# Patient Record
Sex: Male | Born: 1945 | Race: White | Hispanic: No | Marital: Married
Health system: Southern US, Community
[De-identification: ages and names within clinical notes are randomized; demographics above are authoritative.]

## PROBLEM LIST (undated history)

## (undated) DIAGNOSIS — E785 Hyperlipidemia, unspecified: Secondary | ICD-10-CM

## (undated) DIAGNOSIS — I4891 Unspecified atrial fibrillation: Secondary | ICD-10-CM

## (undated) DIAGNOSIS — J449 Chronic obstructive pulmonary disease, unspecified: Secondary | ICD-10-CM

## (undated) DIAGNOSIS — E079 Disorder of thyroid, unspecified: Secondary | ICD-10-CM

## (undated) DIAGNOSIS — I10 Essential (primary) hypertension: Secondary | ICD-10-CM

## (undated) DIAGNOSIS — I2510 Atherosclerotic heart disease of native coronary artery without angina pectoris: Secondary | ICD-10-CM

## (undated) DIAGNOSIS — E039 Hypothyroidism, unspecified: Secondary | ICD-10-CM

## (undated) HISTORY — PX: CARDIOVERSION: SHX1299

## (undated) HISTORY — PX: EYE SURGERY: SHX253

## (undated) HISTORY — PX: CARDIAC CATHETERIZATION: SHX172

## (undated) HISTORY — PX: TONSILLECTOMY: SUR1361

---

## 1997-07-06 ENCOUNTER — Emergency Department (HOSPITAL_COMMUNITY): Admission: RE | Admit: 1997-07-06 | Discharge: 1997-07-06 | Payer: Self-pay | Admitting: Emergency Medicine

## 2000-04-11 HISTORY — PX: CORONARY ARTERY BYPASS GRAFT: SHX141

## 2001-01-24 ENCOUNTER — Encounter: Payer: Self-pay | Admitting: Cardiology

## 2001-01-24 ENCOUNTER — Encounter: Admission: RE | Admit: 2001-01-24 | Discharge: 2001-01-24 | Payer: Self-pay | Admitting: Cardiology

## 2001-01-29 ENCOUNTER — Encounter: Payer: Self-pay | Admitting: Cardiology

## 2001-01-29 ENCOUNTER — Ambulatory Visit (HOSPITAL_COMMUNITY): Admission: RE | Admit: 2001-01-29 | Discharge: 2001-01-29 | Payer: Self-pay | Admitting: Cardiology

## 2001-02-06 ENCOUNTER — Encounter: Payer: Self-pay | Admitting: Cardiothoracic Surgery

## 2001-02-08 ENCOUNTER — Inpatient Hospital Stay (HOSPITAL_COMMUNITY): Admission: RE | Admit: 2001-02-08 | Discharge: 2001-02-13 | Payer: Self-pay | Admitting: Cardiothoracic Surgery

## 2001-02-08 ENCOUNTER — Encounter: Payer: Self-pay | Admitting: Cardiothoracic Surgery

## 2001-02-09 ENCOUNTER — Encounter: Payer: Self-pay | Admitting: Cardiothoracic Surgery

## 2001-02-10 ENCOUNTER — Encounter: Payer: Self-pay | Admitting: Cardiothoracic Surgery

## 2001-03-02 ENCOUNTER — Encounter: Payer: Self-pay | Admitting: Cardiothoracic Surgery

## 2001-03-02 ENCOUNTER — Encounter: Admission: RE | Admit: 2001-03-02 | Discharge: 2001-03-02 | Payer: Self-pay | Admitting: Cardiothoracic Surgery

## 2001-03-13 ENCOUNTER — Encounter (HOSPITAL_COMMUNITY): Admission: RE | Admit: 2001-03-13 | Discharge: 2001-06-11 | Payer: Self-pay | Admitting: Cardiology

## 2001-04-27 ENCOUNTER — Encounter: Payer: Self-pay | Admitting: Cardiothoracic Surgery

## 2001-04-27 ENCOUNTER — Encounter: Admission: RE | Admit: 2001-04-27 | Discharge: 2001-04-27 | Payer: Self-pay | Admitting: Cardiothoracic Surgery

## 2001-05-04 ENCOUNTER — Encounter: Payer: Self-pay | Admitting: Cardiothoracic Surgery

## 2001-05-04 ENCOUNTER — Encounter: Admission: RE | Admit: 2001-05-04 | Discharge: 2001-05-04 | Payer: Self-pay | Admitting: Cardiothoracic Surgery

## 2001-06-12 ENCOUNTER — Encounter (HOSPITAL_COMMUNITY): Admission: RE | Admit: 2001-06-12 | Discharge: 2001-09-10 | Payer: Self-pay | Admitting: Cardiology

## 2007-01-31 ENCOUNTER — Ambulatory Visit: Payer: Self-pay | Admitting: Family Medicine

## 2015-11-16 ENCOUNTER — Telehealth (HOSPITAL_COMMUNITY): Payer: Self-pay

## 2015-11-16 NOTE — Telephone Encounter (Signed)
Called patient in regards to Pulmonary Rehab referral. No way to leave message. Will follow up at another time.

## 2015-12-07 ENCOUNTER — Encounter (HOSPITAL_COMMUNITY)
Admission: RE | Admit: 2015-12-07 | Discharge: 2015-12-07 | Disposition: A | Discharge status: Home / Self Care | Payer: Self-pay | Source: Ambulatory Visit | Attending: Pulmonary Disease | Admitting: Pulmonary Disease

## 2015-12-07 ENCOUNTER — Encounter (HOSPITAL_COMMUNITY): Payer: Self-pay

## 2015-12-07 VITALS — BP 149/81 | HR 89 | Ht 65.0 in | Wt 184.1 lb

## 2015-12-07 DIAGNOSIS — J439 Emphysema, unspecified: Secondary | ICD-10-CM

## 2015-12-07 HISTORY — DX: Hyperlipidemia, unspecified: E78.5

## 2015-12-07 HISTORY — DX: Essential (primary) hypertension: I10

## 2015-12-07 HISTORY — DX: Disorder of thyroid, unspecified: E07.9

## 2015-12-07 NOTE — Progress Notes (Signed)
Lucas OrleansJoseph D Gagan 70 y.o. male Pulmonary Rehab Orientation Note Patient arrived today in Cardiac and Pulmonary Rehab for orientation to Pulmonary Rehab. [facility] was [facility] via wheel chair. He does not carry portable oxygen. Per pt, he uses oxygen never. Color good, skin warm and dry. Patient is oriented to time and place. Patient's medical history, psychosocial health, and medications reviewed. Psychosocial assessment reveals pt lives with their spouse. Pt is currently retired. He is a Research scientist (life sciences)avy Veteran, and lastly worked for [employer].   Pt hobbies include watching sports and movies. He is involved with his grandson taking him to and from football practice 3 times per week and coached football in the past. Pt reports his stress level is low.  Patient  Does not exhibit signs of depression. PHQ2/9 score 0/0. Pt shows good  coping skills with positive outlook .  Physical assessment reveals heart rate is normal, breath sounds clear to auscultation, no wheezes, rales, or rhonchi. Grip strength equal, strong. Distal pulses 2+ bilateral posterior tibial pulses present without peripheral edema. Patient reports he does take medications as prescribed. Patient states he follows a Regular diet. The patient reports no specific efforts to gain or lose weight.. Patient's weight will be monitored closely. Demonstration and practice of PLB using pulse oximeter. Patient able to return demonstration satisfactorily. Safety and hand hygiene in the exercise area reviewed with patient. Patient voices understanding of the information reviewed. Department expectations discussed with patient and achievable goals were set. The patient shows enthusiasm about attending the program and we look forward to working with this nice gentleman. The patient is scheduled for a 6 min walk test on Thursday December 10, 2015 @ 3:30pm and to begin exercise on Thursday, December 17, 2015 in the 1030 class..   [id number]

## 2015-12-09 ENCOUNTER — Encounter (HOSPITAL_COMMUNITY): Payer: Self-pay | Admitting: *Deleted

## 2015-12-10 ENCOUNTER — Encounter (HOSPITAL_COMMUNITY)
Admission: RE | Admit: 2015-12-10 | Discharge: 2015-12-10 | Disposition: A | Discharge status: Home / Self Care | Payer: Self-pay | Source: Ambulatory Visit | Attending: Pulmonary Disease | Admitting: Pulmonary Disease

## 2015-12-10 DIAGNOSIS — J439 Emphysema, unspecified: Secondary | ICD-10-CM

## 2015-12-10 NOTE — Progress Notes (Signed)
Lucas LongsJoseph arrived for his 6 minute walk test, had an irregular pulse, placed on the Zoll and was found to be in atrial fibrillation, rate 85-122, asymptomatic.  BP 138/80, spo2 96% RA.  I spoke with Loistine ChancePhilip a triage nurse at [facility] [place].  I was instructed to have patient call for an appointment at the same day clinic tomorrow morning after 8 am.  Instrutted to take his aspirin 325 mg today.  If he develops any distress or fast heart rate to go to the [facility].  He voiced understanding and was transported [place] the [place] to drive home.  Again, he was in no distress. The 6 minute walk test was not performed.

## 2015-12-17 ENCOUNTER — Telehealth (HOSPITAL_COMMUNITY): Payer: Self-pay | Admitting: *Deleted

## 2015-12-17 ENCOUNTER — Ambulatory Visit (HOSPITAL_COMMUNITY): Payer: Self-pay

## 2015-12-22 ENCOUNTER — Ambulatory Visit (HOSPITAL_COMMUNITY): Payer: Self-pay

## 2015-12-24 ENCOUNTER — Ambulatory Visit (HOSPITAL_COMMUNITY): Payer: Self-pay

## 2015-12-29 ENCOUNTER — Ambulatory Visit (HOSPITAL_COMMUNITY): Payer: Self-pay

## 2015-12-31 ENCOUNTER — Ambulatory Visit (HOSPITAL_COMMUNITY): Payer: Self-pay

## 2016-01-05 ENCOUNTER — Ambulatory Visit (HOSPITAL_COMMUNITY): Payer: Self-pay

## 2016-01-07 ENCOUNTER — Ambulatory Visit (HOSPITAL_COMMUNITY): Payer: Self-pay

## 2016-01-11 NOTE — Addendum Note (Signed)
Encounter addended by: Drema PryJoan A Shantanique Hodo, RN on: 01/11/2016 10:02 AM<BR>    Actions taken: Sign clinical note, Episode resolved

## 2016-01-11 NOTE — Progress Notes (Signed)
Pulmonary Rehab Discharge Note  Unk PintoJoseph Polivka attended orientation 12/07/2015 and returned for his 6 minute walk test on 12/10/2015 and was found to be in atrial fib at a controlled rate, which was a new diagnosis for him.  His walk test was postponed and [facility] [place] was notified and he was asked to call for a next day appointment the following day.  Multiple phone calls have been made by pulmonary rehab staff to check on patient and we are not able to leave a message and paitient has not called us to let us know [facility] findings.  He has been discharged from the program without exercising at all.

## 2016-01-12 ENCOUNTER — Ambulatory Visit (HOSPITAL_COMMUNITY): Payer: Self-pay

## 2016-01-14 ENCOUNTER — Ambulatory Visit (HOSPITAL_COMMUNITY): Payer: Self-pay

## 2016-01-15 NOTE — Addendum Note (Signed)
Encounter addended by: Jacques EarthlyEdna Brewbaker Paulene Tayag, RD on: 01/15/2016 12:11 PM<BR>    Actions taken: Visit Navigator Flowsheet section accepted

## 2016-01-19 ENCOUNTER — Ambulatory Visit (HOSPITAL_COMMUNITY): Payer: Self-pay

## 2016-01-21 ENCOUNTER — Ambulatory Visit (HOSPITAL_COMMUNITY): Payer: Self-pay

## 2016-01-26 ENCOUNTER — Ambulatory Visit (HOSPITAL_COMMUNITY): Payer: Self-pay

## 2016-01-28 ENCOUNTER — Ambulatory Visit (HOSPITAL_COMMUNITY): Payer: Self-pay

## 2016-02-02 ENCOUNTER — Ambulatory Visit (HOSPITAL_COMMUNITY): Payer: Self-pay

## 2016-02-04 ENCOUNTER — Ambulatory Visit (HOSPITAL_COMMUNITY): Payer: Self-pay

## 2016-02-09 ENCOUNTER — Ambulatory Visit (HOSPITAL_COMMUNITY): Payer: Self-pay

## 2016-02-11 ENCOUNTER — Ambulatory Visit (HOSPITAL_COMMUNITY): Payer: Self-pay

## 2016-02-16 ENCOUNTER — Ambulatory Visit (HOSPITAL_COMMUNITY): Payer: Self-pay

## 2016-02-18 ENCOUNTER — Ambulatory Visit (HOSPITAL_COMMUNITY): Payer: Self-pay

## 2016-02-23 ENCOUNTER — Ambulatory Visit (HOSPITAL_COMMUNITY): Payer: Self-pay

## 2016-02-25 ENCOUNTER — Ambulatory Visit (HOSPITAL_COMMUNITY): Payer: Self-pay

## 2016-03-01 ENCOUNTER — Ambulatory Visit (HOSPITAL_COMMUNITY): Payer: Self-pay

## 2016-03-03 ENCOUNTER — Ambulatory Visit (HOSPITAL_COMMUNITY): Payer: Self-pay

## 2016-03-08 ENCOUNTER — Ambulatory Visit (HOSPITAL_COMMUNITY): Payer: Self-pay

## 2016-03-10 ENCOUNTER — Ambulatory Visit (HOSPITAL_COMMUNITY): Payer: Self-pay

## 2016-03-15 ENCOUNTER — Ambulatory Visit (HOSPITAL_COMMUNITY): Payer: Self-pay

## 2016-03-17 ENCOUNTER — Ambulatory Visit (HOSPITAL_COMMUNITY): Payer: Self-pay

## 2016-03-22 ENCOUNTER — Ambulatory Visit (HOSPITAL_COMMUNITY): Payer: Self-pay

## 2016-03-24 ENCOUNTER — Ambulatory Visit (HOSPITAL_COMMUNITY): Payer: Self-pay

## 2016-03-29 ENCOUNTER — Ambulatory Visit (HOSPITAL_COMMUNITY): Payer: Self-pay

## 2016-03-31 ENCOUNTER — Ambulatory Visit (HOSPITAL_COMMUNITY): Payer: Self-pay

## 2016-04-05 ENCOUNTER — Ambulatory Visit (HOSPITAL_COMMUNITY): Payer: Self-pay

## 2016-04-07 ENCOUNTER — Ambulatory Visit (HOSPITAL_COMMUNITY): Payer: Self-pay

## 2016-04-12 ENCOUNTER — Ambulatory Visit (HOSPITAL_COMMUNITY): Payer: Self-pay

## 2016-04-14 ENCOUNTER — Ambulatory Visit (HOSPITAL_COMMUNITY): Payer: Self-pay

## 2016-04-19 ENCOUNTER — Ambulatory Visit (HOSPITAL_COMMUNITY): Payer: Self-pay

## 2016-04-21 ENCOUNTER — Ambulatory Visit (HOSPITAL_COMMUNITY): Payer: Self-pay

## 2016-04-26 ENCOUNTER — Ambulatory Visit (HOSPITAL_COMMUNITY): Payer: Self-pay

## 2016-04-28 ENCOUNTER — Ambulatory Visit (HOSPITAL_COMMUNITY): Payer: Self-pay

## 2016-05-03 ENCOUNTER — Ambulatory Visit (HOSPITAL_COMMUNITY): Payer: Self-pay

## 2016-05-09 ENCOUNTER — Telehealth (HOSPITAL_COMMUNITY): Payer: Self-pay | Admitting: *Deleted

## 2016-05-10 ENCOUNTER — Telehealth (HOSPITAL_COMMUNITY): Payer: Self-pay | Admitting: *Deleted

## 2018-02-14 ENCOUNTER — Ambulatory Visit (INDEPENDENT_AMBULATORY_CARE_PROVIDER_SITE_OTHER): Payer: No Typology Code available for payment source | Admitting: Vascular Surgery

## 2018-02-14 ENCOUNTER — Encounter: Payer: Self-pay | Admitting: Vascular Surgery

## 2018-02-14 ENCOUNTER — Other Ambulatory Visit: Payer: Self-pay

## 2018-02-14 VITALS — BP 137/86 | HR 83 | Temp 98.7°F | Resp 20 | Ht 65.0 in | Wt 174.0 lb

## 2018-02-14 DIAGNOSIS — I714 Abdominal aortic aneurysm, without rupture, unspecified: Secondary | ICD-10-CM

## 2018-02-14 NOTE — Progress Notes (Signed)
REASON FOR CONSULT:    Abdominal aortic aneurysm.  The consult was requested by the [place].  HPI:   Lucas Garcia is a pleasant 72 y.o. male, who was referred with a 5.5 cm infrarenal abdominal aortic aneurysm.  This was apparently discovered about a year and a half ago.  Over the last year this is enlarged from 5 cm to 5.5 cm according to the patient.  He denies any abdominal pain or back pain.  He has no family history of aneurysmal disease that he is aware of.  He does have significant pulmonary issues and uses oxygen as needed.  He has undergone previous coronary revascularization in 2002.  Vein was taken from the right leg.  He said no recent chest pain or chest pressure.  He denies any history of previous myocardial infarction or history of congestive heart failure.  He does have hypertension and hypercholesterolemia which are well controlled on medications.  He denies any history of diabetes.  He is on Xarelto for atrial fibrillation.  He apparently has been cardioverted once but his A. fib returned.  This is followed by his cardiologist at the [place].  Past Medical History:  Diagnosis Date  . Hyperlipidemia   . Hypertension   . Thyroid disease     Family History  Problem Relation Age of Onset  . Alzheimer's disease Mother   . Heart disease Father     SOCIAL HISTORY: He had smoked to 2 packs/day of cigarettes for many years but quit 2 years ago. Social History   Socioeconomic History  . Marital status: Married    Spouse name: Not on file  . Number of children: Not on file  . Years of education: Not on file  . Highest education level: Not on file  Occupational History  . Not on file  Social Needs  . Financial resource strain: Not on file  . Food insecurity:    Worry: Not on file    Inability: Not on file  . Transportation needs:    Medical: Not on file    Non-medical: Not on file  Tobacco Use  . Smoking status: Former Smoker    Packs/day: 0.25    Years: 58.00      Pack years: 14.50    Types: Cigarettes    Last attempt to quit: 04/11/2016    Years since quitting: 1.8  . Smokeless tobacco: Never Used  Substance and Sexual Activity  . Alcohol use: No  . Drug use: No  . Sexual activity: Not on file  Lifestyle  . Physical activity:    Days per week: Not on file    Minutes per session: Not on file  . Stress: Not on file  Relationships  . Social connections:    Talks on phone: Not on file    Gets together: Not on file    Attends religious service: Not on file    Active member of club or organization: Not on file    Attends meetings of clubs or organizations: Not on file    Relationship status: Not on file  . Intimate partner violence:    Fear of current or ex partner: Not on file    Emotionally abused: Not on file    Physically abused: Not on file    Forced sexual activity: Not on file  Other Topics Concern  . Not on file  Social History Narrative  . Not on file    No Known Allergies  Current Outpatient Medications  Medication Sig Dispense Refill  . albuterol (PROVENTIL HFA;VENTOLIN HFA) 108 (90 Base) MCG/ACT inhaler Inhale 2 puffs into the lungs every 6 (six) hours as needed for wheezing or shortness of breath.    Marland Kitchen amLODipine (NORVASC) 10 MG tablet Take 10 mg by mouth daily.    Marland Kitchen aspirin 325 MG EC tablet Take 325 mg by mouth daily.    Marland Kitchen atorvastatin (LIPITOR) 40 MG tablet Take 40 mg by mouth daily.    . Cholecalciferol (VITAMIN D) 50 MCG (2000 UT) tablet Take 2,000 Units by mouth daily.    . ferrous sulfate 325 (65 FE) MG tablet Take 325 mg by mouth daily with breakfast.    . Ipratropium-Albuterol (COMBIVENT RESPIMAT IN) Inhale into the lungs.    Marland Kitchen levothyroxine (SYNTHROID, LEVOTHROID) 88 MCG tablet Take 88 mcg by mouth daily before breakfast.    . magnesium oxide (MAG-OX) 400 MG tablet Take 400 mg by mouth daily.    . rivaroxaban (XARELTO) 20 MG TABS tablet Take 20 mg by mouth daily with supper.    . tiotropium (SPIRIVA) 18 MCG  inhalation capsule Place 18 mcg into inhaler and inhale daily.    . traZODone (DESYREL) 50 MG tablet Take 50 mg by mouth at bedtime.    Marland Kitchen TIOTROPIUM BROMIDE-OLODATEROL IN Inhale into the lungs.     No current facility-administered medications for this visit.     REVIEW OF SYSTEMS:  [X]  denotes positive finding, [ ]  denotes negative finding Cardiac  Comments:  Chest pain or chest pressure:    Shortness of breath upon exertion: x   Short of breath when lying flat: x   Irregular heart rhythm: x  A. fib      Vascular    Pain in calf, thigh, or hip brought on by ambulation:    Pain in feet at night that wakes you up from your sleep:     Blood clot in your veins:    Leg swelling:         Pulmonary    Oxygen at home:    Productive cough:     Wheezing:         Neurologic    Sudden weakness in arms or legs:     Sudden numbness in arms or legs:     Sudden onset of difficulty speaking or slurred speech:    Temporary loss of vision in one eye:     Problems with dizziness:         Gastrointestinal    Blood in stool:     Vomited blood:         Genitourinary    Burning when urinating:     Blood in urine:        Psychiatric    Major depression:         Hematologic    Bleeding problems:    Problems with blood clotting too easily:        Skin    Rashes or ulcers:        Constitutional    Fever or chills:     PHYSICAL EXAM:   Vitals:   02/14/18 1511  BP: 137/86  Pulse: 83  Resp: 20  Temp: 98.7 F (37.1 C)  TempSrc: Oral  SpO2: (!) 89%  Weight: 174 lb (78.9 kg)  Height: 5\' 5"  (1.651 m)    GENERAL: The patient is a well-nourished male, in no acute distress. The vital signs are documented above. CARDIAC: There is a regular rate and  rhythm.  VASCULAR: I do not detect carotid bruits. He has palpable femoral pulses bilaterally. I cannot palpate popliteal pulses. He has monophasic posterior tibial signals with the Doppler and barely biphasic dorsalis pedis signals with  the Doppler bilaterally. He has no significant lower extremity swelling. PULMONARY: There is good air exchange bilaterally without wheezing or rales. ABDOMEN: Soft and non-tender with normal pitched bowel sounds.  His aneurysm is palpable and nontender. MUSCULOSKELETAL: There are no major deformities or cyanosis. NEUROLOGIC: No focal weakness or paresthesias are detected. SKIN: There are no ulcers or rashes noted. PSYCHIATRIC: The patient has a normal affect.  DATA:    CT ABDOMEN PELVIS: He brought a disc with him of his CT abdomen pelvis and it looks like this was done without contrast.  I was only able to retrieve compressed images of his scan and therefore could not adequately assess his aneurysm for consideration for endovascular stent grafting.  Based on the small images I am somewhat concerned that the neck of the aneurysm may be larger than 32 mm which may prevent him from having an endovascular repair.    ASSESSMENT & PLAN:   5.5 CM INFRARENAL ABDOMINAL AORTIC ANEURYSM: This patient has a 5.5 cm asymptomatic infrarenal abdominal aortic aneurysm.  Given the size of the aneurysm I have recommended elective repair.  The risk of rupture for angina and the size is approximately 5 to 10 %/year.  Fortunately I cannot adequately get the information I need to determine if he is a candidate for an endovascular aneurysm repair based on the CT scan that I have.  We will need to repeat his CT scan with thin cuts and with intravenous contrast to further assess the aneurysm.  I have discussed with him the procedure and potential complications of endovascular repair in detail.  He understands the need for lifelong follow-up.  If he is not a candidate for endovascular repair he would have to consider open repair.  I think he would be at increased risk for open repair given his pulmonary status.  He has significant shortness of breath and is on oxygen as needed.  He has undergone preoperative cardiac clearance  at the [place] and reportedly had a normal stress test.  However the patient is on Xarelto for atrial fibrillation and will need to check with cardiology to be sure that this could be held preoperatively.  I suspect that he would not need a heparin bridge.  I will make further recommendations pending the results of his CT angiogram with IV contrast.  I will also take his disc to the hospital to see if perhaps I can retrieve the images there is I had limited access to the images here in the office.  The patient's contact number is [phone].   Waverly Ferrari [facility] [phone]

## 2018-02-21 ENCOUNTER — Other Ambulatory Visit: Payer: Self-pay

## 2018-02-21 DIAGNOSIS — I713 Abdominal aortic aneurysm, ruptured, unspecified: Secondary | ICD-10-CM

## 2018-03-28 ENCOUNTER — Ambulatory Visit
Admission: RE | Admit: 2018-03-28 | Discharge: 2018-03-28 | Disposition: A | Discharge status: Home / Self Care | Payer: Non-veteran care | Source: Ambulatory Visit | Attending: Vascular Surgery | Admitting: Vascular Surgery

## 2018-03-28 ENCOUNTER — Encounter: Payer: Self-pay | Admitting: Vascular Surgery

## 2018-03-28 ENCOUNTER — Other Ambulatory Visit: Payer: Self-pay

## 2018-03-28 ENCOUNTER — Ambulatory Visit (INDEPENDENT_AMBULATORY_CARE_PROVIDER_SITE_OTHER): Payer: No Typology Code available for payment source | Admitting: Vascular Surgery

## 2018-03-28 ENCOUNTER — Encounter: Payer: Self-pay | Admitting: *Deleted

## 2018-03-28 VITALS — BP 122/77 | HR 93 | Temp 97.5°F | Resp 20 | Ht 65.0 in | Wt 174.0 lb

## 2018-03-28 DIAGNOSIS — I714 Abdominal aortic aneurysm, without rupture, unspecified: Secondary | ICD-10-CM

## 2018-03-28 DIAGNOSIS — I713 Abdominal aortic aneurysm, ruptured, unspecified: Secondary | ICD-10-CM

## 2018-03-28 MED ORDER — IOPAMIDOL (ISOVUE-370) INJECTION 76%
75.0000 mL | Freq: Once | INTRAVENOUS | Status: AC | PRN
  Administered 2018-03-28: 75 mL via INTRAVENOUS

## 2018-03-28 NOTE — Progress Notes (Signed)
Patient name: Lucas Garcia MRN: [id number] DOB: 05/29/1945 Sex: male  REASON FOR VISIT:   Follow-up of abdominal aortic aneurysm.  HPI:   Lucas Garcia is a pleasant 72 y.o. male who I last saw on 02/14/2018.  He was referred with a 5.5 cm infrarenal abdominal aortic aneurysm.  This had enlarged from 5.0 to 5.5 cm.  Of note the patient does have some pulmonary issues and uses home oxygen as needed.  In addition he has coronary artery disease and underwent coronary revascularization in 2002.  He has not had any recent chest pain or chest pressure.  Patient is on Xarelto for atrial fibrillation.  He is followed by his cardiologist at the [facility].  I reviewed the disc that he brought with him but this was done without contrast and I had a difficult time retrieving the compressed images.  I did not feel that I could adequately assess his aneurysm in order to consider endovascular repair.  I recommended that he undergo a thin cut CT with IV contrast.  He comes in today to discuss these results.  The patient denies any abdominal pain or back pain.  He does have significant COPD and uses an inhaler and oxygen as needed.  He has had no recent chest pain or chest pressure.  Current Outpatient Medications  Medication Sig Dispense Refill  . albuterol (PROVENTIL HFA;VENTOLIN HFA) 108 (90 Base) MCG/ACT inhaler Inhale 2 puffs into the lungs every 6 (six) hours as needed for wheezing or shortness of breath.    Marland Kitchen. amLODipine (NORVASC) 10 MG tablet Take 10 mg by mouth daily.    Marland Kitchen. aspirin 325 MG EC tablet Take 325 mg by mouth daily.    Marland Kitchen. atorvastatin (LIPITOR) 40 MG tablet Take 40 mg by mouth daily.    . Cholecalciferol (VITAMIN D) 50 MCG (2000 UT) tablet Take 2,000 Units by mouth daily.    . ferrous sulfate 325 (65 FE) MG tablet Take 325 mg by mouth daily with breakfast.    . Ipratropium-Albuterol (COMBIVENT RESPIMAT IN) Inhale into the lungs.    Marland Kitchen. levothyroxine (SYNTHROID, LEVOTHROID) 88 MCG tablet Take 88  mcg by mouth daily before breakfast.    . magnesium oxide (MAG-OX) 400 MG tablet Take 400 mg by mouth daily.    . metoprolol succinate (TOPROL-XL) 100 MG 24 hr tablet Take 100 mg by mouth daily. Take with or immediately following a meal.    . rivaroxaban (XARELTO) 20 MG TABS tablet Take 20 mg by mouth daily with supper.    . traZODone (DESYREL) 50 MG tablet Take 50 mg by mouth at bedtime.     No current facility-administered medications for this visit.     REVIEW OF SYSTEMS:  [X]  denotes positive finding, [ ]  denotes negative finding Vascular    Leg swelling    Cardiac    Chest pain or chest pressure:    Shortness of breath upon exertion:    Short of breath when lying flat:    Irregular heart rhythm:    Constitutional    Fever or chills:     PHYSICAL EXAM:   Vitals:   03/28/18 1446  Weight: 174 lb (78.9 kg)  Height: 5\' 5"  (1.651 m)    GENERAL: The patient is a well-nourished male, in no acute distress. The vital signs are documented above. CARDIOVASCULAR: There is a regular rate and rhythm. PULMONARY: There is good air exchange bilaterally without wheezing or rales. ABDOMEN: His abdomen is soft and nontender.  Because of its size it is impossible to palpate his aneurysm. VASCULAR: I do not detect carotid bruits. I cannot palpate pedal pulses however he has a monophasic dorsalis pedis and posterior tibial signal bilaterally.  DATA:   CT ANGIOGRAM ABDOMEN PELVIS: I have reviewed his CT angiogram of the abdomen and pelvis.  He does appear to be a reasonable candidate for an endovascular stent graft.  There is some taper to the neck.  The aneurysm measures 5.6 cm in maximum diameter.  There does not appear to be adequate length of the neck.  Both superficial femoral arteries are occluded.  Of note, the radiologist noted some circumferential wall thickening of the hepatic flexure of the colon which was potentially artifact due to under distention although a colonic mass cannot be  excluded on the basis of this exam.  If the patient had not had previous colonoscopy further evaluation with colonoscopy was advised.  In addition, he noted an apparent masslike thickening involving the anterior aspect of the urinary bladder which again could potentially be artifact due to under distention although a discrete urinary bladder mass could not be excluded on the basis of this exam.  The recommendation by radiology was for further evaluation potentially with urine cytology versus a multiphase renal protocol CT scan or cystoscopy.  MEDICAL ISSUES:   5.6 CM INFRARENAL ABDOMINAL AORTIC ANEURYSM: Based on the CT angiogram I think he is a candidate for endovascular repair. I have discussed the indications for aneurysm repair. I have explained that without repair, the risk of the aneurysm rupturing is 5-10% per year. We have discussed the advantages and disadvantages of open versus endovascular repair. The patient wishes to proceed with endovascular aneurysm repair (EVAR). I have discussed the potential complications of EVAR, including, but not limited to: bleeding, infection, arterial injury, graft migration, endoleak, renal failure, MI or other unpredictable medical problems. We have discussed the possibility of having to convert to open repair. We also discussed the need for continued lifelong follow-up after EVAR. All of the patients questions were answered and they are agreeable to proceed with surgery.   We will obtain preoperative cardiac clearance by his cardiologist at the [facility].  With respect to the CT findings concerning his colon and bladder we will defer further work-up for this to his primary care physician at the [facility].  A total of 35 minutes was spent on this visit. 20 minutes was face to face time. More than 50% of the time was spent on counseling and coordinating with the patient.   Waverly Ferrari [facility] [phone]

## 2018-03-30 ENCOUNTER — Encounter: Payer: Self-pay | Admitting: Vascular Surgery

## 2018-04-26 ENCOUNTER — Other Ambulatory Visit: Payer: Self-pay | Admitting: *Deleted

## 2018-05-01 NOTE — Pre-Procedure Instructions (Signed)
Lucas Garcia  05/01/2018      [facility] - [name] - [address] [place] [address] Phone: [phone] Fax: [phone]    Your procedure is scheduled on 05/07/2018.  Report to [facility] Admitting at 0530 A.M.  Call this number if you have problems the morning of surgery:  [phone]   Remember:  Do not eat or drink after midnight.    Take these medicines the morning of surgery with A SIP OF WATER: Albuterol (Proventil; Ventolin) inhaler - if needed (bring with you to the hospital the morning of surgery) Ipratropium-albuterol (Combivent respimat) inhaler Levothyroxine (Synthroid) Metoprolol succinate (Toprol-XL)  7 days prior to surgery STOP taking any Aspirin (unless otherwise instructed by your surgeon), Aleve, Naproxen, Ibuprofen, Motrin, Advil, Goody's, BC's, all herbal medications, fish oil, and all vitamins.  Follow your surgeon's instructions on when to stop Xarelto.  If no instructions were given by your surgeon then you will need to call the office to get those instructions.       Do not wear jewelry, make-up or nail polish.  Do not wear lotions, powders, or perfumes, or deodorant.  Do not shave 48 hours prior to surgery.    Do not bring valuables to the hospital.  [facility] is not responsible for any belongings or valuables.  Contacts, eyeglasses, hearing aids, dentures or bridgework may not be worn into surgery.  Leave your suitcase in the car.  After surgery it may be brought to your room.  For patients admitted to the hospital, discharge time will be determined by your treatment team.  Patients discharged the day of surgery will not be allowed to drive home.   Name and phone number of your driver:    Special instructions:   [facility]- Preparing For Surgery  Before surgery, you can play an important role. Because skin is not sterile, your skin needs to be as free of germs as possible. You  can reduce the number of germs on your skin by washing with CHG (chlorahexidine gluconate) Soap before surgery.  CHG is an antiseptic cleaner which kills germs and bonds with the skin to continue killing germs even after washing.    Oral Hygiene is also important to reduce your risk of infection.  Remember - BRUSH YOUR TEETH THE MORNING OF SURGERY WITH YOUR REGULAR TOOTHPASTE  Please do not use if you have an allergy to CHG or antibacterial soaps. If your skin becomes reddened/irritated stop using the CHG.  Do not shave (including legs and underarms) for at least 48 hours prior to first CHG shower. It is OK to shave your face.  Please follow these instructions carefully.   1. Shower the NIGHT BEFORE SURGERY and the MORNING OF SURGERY with CHG.   2. If you chose to wash your hair, wash your hair first as usual with your normal shampoo.  3. After you shampoo, rinse your hair and body thoroughly to remove the shampoo.  4. Use CHG as you would any other liquid soap. You can apply CHG directly to the skin and wash gently with a scrungie or a clean washcloth.   5. Apply the CHG Soap to your body ONLY FROM THE NECK DOWN.  Do not use on open wounds or open sores. Avoid contact with your eyes, ears, mouth and genitals (private parts). Wash Face and genitals (private parts)  with your normal soap.  6. Wash thoroughly, paying special attention to the  area where your surgery will be performed.  7. Thoroughly rinse your body with warm water from the neck down.  8. DO NOT shower/wash with your normal soap after using and rinsing off the CHG Soap.  9. Pat yourself dry with a CLEAN TOWEL.  10. Wear CLEAN PAJAMAS to bed the night before surgery, wear comfortable clothes the morning of surgery  11. Place CLEAN SHEETS on your bed the night of your first shower and DO NOT SLEEP WITH PETS.    Day of Surgery: Shower as stated above Do not apply any deodorants/lotions.  Please wear clean clothes to the  hospital/surgery center.   Remember to brush your teeth WITH YOUR REGULAR TOOTHPASTE.    Please read over the following fact sheets that you were given.

## 2018-05-02 ENCOUNTER — Encounter (HOSPITAL_COMMUNITY): Payer: Self-pay

## 2018-05-02 ENCOUNTER — Other Ambulatory Visit: Payer: Self-pay

## 2018-05-02 ENCOUNTER — Encounter (HOSPITAL_COMMUNITY)
Admission: RE | Admit: 2018-05-02 | Discharge: 2018-05-02 | Disposition: A | Discharge status: Home / Self Care | Payer: No Typology Code available for payment source | Source: Ambulatory Visit | Attending: Vascular Surgery | Admitting: Vascular Surgery

## 2018-05-02 DIAGNOSIS — R9431 Abnormal electrocardiogram [ECG] [EKG]: Secondary | ICD-10-CM | POA: Diagnosis not present

## 2018-05-02 DIAGNOSIS — Z01818 Encounter for other preprocedural examination: Secondary | ICD-10-CM | POA: Insufficient documentation

## 2018-05-02 DIAGNOSIS — I4891 Unspecified atrial fibrillation: Secondary | ICD-10-CM | POA: Insufficient documentation

## 2018-05-02 HISTORY — DX: Hypothyroidism, unspecified: E03.9

## 2018-05-02 HISTORY — DX: Unspecified atrial fibrillation: I48.91

## 2018-05-02 HISTORY — DX: Atherosclerotic heart disease of native coronary artery without angina pectoris: I25.10

## 2018-05-02 HISTORY — DX: Chronic obstructive pulmonary disease, unspecified: J44.9

## 2018-05-02 LAB — BLOOD GAS, ARTERIAL
ACID-BASE EXCESS: 3.1 mmol/L — AB (ref 0.0–2.0)
BICARBONATE: 26.6 mmol/L (ref 20.0–28.0)
DRAWN BY: 265211
O2 CONTENT: 3 L/min
O2 SAT: 93.5 %
PH ART: 7.467 — AB (ref 7.350–7.450)
Patient temperature: 98.6
pCO2 arterial: 37.3 mmHg (ref 32.0–48.0)
pO2, Arterial: 66.6 mmHg — ABNORMAL LOW (ref 83.0–108.0)

## 2018-05-02 LAB — COMPREHENSIVE METABOLIC PANEL
ALBUMIN: 3.6 g/dL (ref 3.5–5.0)
ALT: 11 U/L (ref 0–44)
ANION GAP: 9 (ref 5–15)
AST: 12 U/L — ABNORMAL LOW (ref 15–41)
Alkaline Phosphatase: 44 U/L (ref 38–126)
BILIRUBIN TOTAL: 1.3 mg/dL — AB (ref 0.3–1.2)
BUN: 15 mg/dL (ref 8–23)
CO2: 23 mmol/L (ref 22–32)
Calcium: 9.1 mg/dL (ref 8.9–10.3)
Chloride: 107 mmol/L (ref 98–111)
Creatinine, Ser: 0.94 mg/dL (ref 0.61–1.24)
GFR calc Af Amer: >60 mL/min (ref >60)
Glucose, Bld: 178 mg/dL — ABNORMAL HIGH (ref 70–99)
POTASSIUM: 3.3 mmol/L — AB (ref 3.5–5.1)
Sodium: 139 mmol/L (ref 135–145)
TOTAL PROTEIN: 6.3 g/dL — AB (ref 6.5–8.1)

## 2018-05-02 LAB — CBC
HEMATOCRIT: 40.3 % (ref 39.0–52.0)
Hemoglobin: 13.7 g/dL (ref 13.0–17.0)
MCH: 29.3 pg (ref 26.0–34.0)
MCHC: 34 g/dL (ref 30.0–36.0)
MCV: 86.1 fL (ref 80.0–100.0)
Platelets: 214 10*3/uL (ref 150–400)
RBC: 4.68 MIL/uL (ref 4.22–5.81)
RDW: 13.1 % (ref 11.5–15.5)
WBC: 6.6 10*3/uL (ref 4.0–10.5)
nRBC: 0 % (ref 0.0–0.2)

## 2018-05-02 LAB — URINALYSIS, ROUTINE W REFLEX MICROSCOPIC
Bilirubin Urine: NEGATIVE
Glucose, UA: NEGATIVE mg/dL
Hgb urine dipstick: NEGATIVE
Ketones, ur: NEGATIVE mg/dL
Leukocytes, UA: NEGATIVE
Nitrite: NEGATIVE
Protein, ur: NEGATIVE mg/dL
Specific Gravity, Urine: 1.016 (ref 1.005–1.030)
pH: 5 (ref 5.0–8.0)

## 2018-05-02 LAB — TYPE AND SCREEN
ABO/RH(D): A POS
Antibody Screen: NEGATIVE

## 2018-05-02 LAB — SURGICAL PCR SCREEN
MRSA, PCR: NEGATIVE
Staphylococcus aureus: NEGATIVE

## 2018-05-02 LAB — ABO/RH: ABO/RH(D): A POS

## 2018-05-02 NOTE — Progress Notes (Signed)
PCP - Dr. Earna Coder  Cardiologist - Dr. Ilsa Iha- [facility] in [place] (M)- Copy placed in chart  Chest x-ray - Denies  EKG - 05/02/2018  Stress Test - 02/08/18 (M)  ECHO - 08/03/16 (M)  Cardiac Cath - 2002 (M)  AICD- na PM- na LOOP- na  Sleep Study - Denies CPAP - None  LABS- 05/02/2018: CBC, CMP, ABG, T/S, UA, PCR  ASA- Denies Xarelto- LD- 1/24   Anesthesia- Yes- cardiac history  Pt denies having chest pain, sob, or fever at this time. All instructions explained to the pt, with a verbal understanding of the material. Pt agrees to go over the instructions while at home for a better understanding. The opportunity to ask questions was provided.

## 2018-05-03 NOTE — Progress Notes (Signed)
Anesthesia Chart Review:  Case:  [id number] Date/Time:  05/07/18 0715   Procedure:  ABDOMINAL AORTIC ENDOVASCULAR STENT GRAFT (N/A )   Anesthesia type:  General   Pre-op diagnosis:  ABDOMINAL AORTIC ANEURYSM   Location:  [facility] ROOM 16 / [facility] OR   Surgeon:  Chuck Hint, MD      DISCUSSION: 73 yo male former smoker. Pertinent hx includes Afib, COPD (PRN O2), HTN, CAD (s/p CABG x 4 2002), hypothyroid.  Pt follows with cardiologist at [facility], Dr. Rossie Muskrat, for CAD s/p CABG x 4 2002. Pt was cleared for surgery 03/29/18 stating "recent stress test without significant ischemia.  Given risk factors above as well as O2 dependent COPD, he will remain at high risk for perioperative cardiac event up to 12% for this upcoming planned intermediate risk surgery.  Continue metoprolol, but avoid excessive hypotension or bradycardia perioperatively.  May hold doses if needed, but avoid discontinuation to prevent rebound hypertension."  I called the pt to clarify his COPD and oxygen use. He said that he was started on O2 approx. two years ago when hospitalized for the flu. He was discharged on continuous O2 initially but weaned down to PRN use. He had a home concentrator but it was removed due to him not needing it anymore. Currently he uses 2L O2 PRN with activity. Says he also uses it when laying flat on his back because he feels SOB in that position, but only rarely uses it at night, sleeps with 1-2 pillows. He says he does not need it when around the house, and says if the weather is not too hot or too cold he can do some walking outdoors without needing it. He tends to use it more in cold weather. He says he really just considers it an "emergency backup". Reports his breathing has been stable, no recent changes. Follows with pulmonology Q6 months at the [place], Dr. Jenita Seashore.  LD Xarelto 05/04/18  Anticipate he can proceed as planned barring acute status change.   VS: BP (!) 148/85   Pulse 78    Temp 36.9 C   Resp 20   Ht 5\' 6"  (1.676 m)   Wt 80.1 kg   SpO2 95%   BMI 28.49 kg/m   PROVIDERS: Vernelle Emerald, MD is PCP  Rossie Muskrat, MD is Cardiologist  Jenita Seashore, MD is Pulmonologist  LABS: Labs reviewed: Acceptable for surgery. (all labs ordered are listed, but only abnormal results are displayed)  Labs Reviewed  BLOOD GAS, ARTERIAL - Abnormal; Notable for the following components:      Result Value   pH, Arterial 7.467 (*)    pO2, Arterial 66.6 (*)    Acid-Base Excess 3.1 (*)    All other components within normal limits  COMPREHENSIVE METABOLIC PANEL - Abnormal; Notable for the following components:   Potassium 3.3 (*)    Glucose, Bld 178 (*)    Total Protein 6.3 (*)    AST 12 (*)    Total Bilirubin 1.3 (*)    All other components within normal limits  SURGICAL PCR SCREEN  CBC  URINALYSIS, ROUTINE W REFLEX MICROSCOPIC  TYPE AND SCREEN  ABO/RH     IMAGES: CTA 03/28/2018: IMPRESSION: VASCULAR  1. Infrarenal abdominal aortic aneurysm measuring 5.6 cm in maximal diameter. Aortic aneurysm NOS (ICD10-I71.9). 2. Large amount of mural thrombus within the dominant component of the abdominal aortic aneurysm without evidence of abdominal aortic dissection or periaortic stranding. 3. The left superficial femoral  artery is occluded at its origin. 4. Apparent occlusion of the proximal aspect of the right superficial femoral artery, potentially artifactual due to contrast bolus administration. Correlation for right lower extremity PAD symptoms is advised. 5.  Aortic Atherosclerosis (ICD10-I70.0).  NON-VASCULAR  1. Apparent circumferential wall thickening of the hepatic flexure of the colon, potentially artifactual due to underdistention though a colonic mass is not excluded on the basis this examination. If not recently performed, further evaluation with colonoscopy is advised. 2. Apparent masslike thickening involving the anterior aspect of  the urinary bladder, potentially artifactual due to underdistention though a discrete urinary bladder mass is not excluded on the basis of this examination. Further evaluation could be performed with urine cytology, dedicated multiphase renal protocol CT scan and/or cystoscopy as clinically indicated. 3. Extensive diverticulosis without evidence of superimposed acute diverticulitis.  EKG: Atrial fibrillation. Ventricular rate 82. Nonspecific ST and T wave abnormality.  CV: Nuclear stress 02/08/2018 [facility], copy on pt chart): Impression: 1.  No scintigraphic findings to suggest inducible ischemia.  Questionable hypokinesis of the septal wall.  Calculated ejection fraction of 43%.  Correlate with most recent echocardiogram.  TTE 08/03/2016 [facility], copy on patient chart): There is moderate concentric left ventricular hypertrophy.  Left ventricular systolic function is normal.  Ejection fraction equal to greater than 55%.  No regional wall motion abnormalities noted.  Diastolic dysfunction, grade 2.  Left atrium is mildly dilated.  No hemodynamically significant valvular aortic stenosis.  There is trace mitral regurgitation.  There is trace tricuspid rotation.  Right ventricular systolic pressure is not well evaluated.  Carotid Dopplers 06/02/2010 [facility], copy on patient chart): There is intimal thickening of the bilateral proximal mid and distal CCA.  There is mixed, calcified and noncalcified focal plaque seen in bilateral carotid bulbs extending into bilateral CCA proximal origins.  The vertebral arteries are patent and demonstrate antegrade flow. Impression: No evidence for hemodynamically significant stenosis.  Past Medical History:  Diagnosis Date  . A-fib (HCC)   . COPD (chronic obstructive pulmonary disease) (HCC)   . Coronary artery disease   . Hyperlipidemia   . Hypertension   . Hypothyroidism   . Thyroid disease     Past Surgical History:  Procedure Laterality Date  . CARDIAC  CATHETERIZATION    . CARDIOVERSION    . CORONARY ARTERY BYPASS GRAFT  2002  . EYE SURGERY     Bilateral Cataracts  . TONSILLECTOMY      MEDICATIONS: . albuterol (PROVENTIL HFA;VENTOLIN HFA) 108 (90 Base) MCG/ACT inhaler  . atorvastatin (LIPITOR) 40 MG tablet  . Cholecalciferol (VITAMIN D) 50 MCG (2000 UT) tablet  . ferrous sulfate 325 (65 FE) MG tablet  . furosemide (LASIX) 20 MG tablet  . Ipratropium-Albuterol (COMBIVENT RESPIMAT IN)  . levothyroxine (SYNTHROID, LEVOTHROID) 112 MCG tablet  . metoprolol succinate (TOPROL-XL) 100 MG 24 hr tablet  . rivaroxaban (XARELTO) 20 MG TABS tablet  . traZODone (DESYREL) 50 MG tablet   No current facility-administered medications for this encounter.     Zannie Cove [facility] Short Stay Center/Anesthesiology Phone [phone] 05/03/2018 9:47 AM

## 2018-05-03 NOTE — Anesthesia Preprocedure Evaluation (Addendum)
Anesthesia Evaluation  Patient identified by MRN, date of birth, ID band Patient awake    Reviewed: Allergy & Precautions, NPO status , Patient's Chart, lab work & pertinent test results  History of Anesthesia Complications Negative for: history of anesthetic complications  Airway Mallampati: II  TM Distance: >3 FB Neck ROM: Full    Dental  (+) Edentulous Upper, Dental Advisory Given   Pulmonary COPD (prn O2),  COPD inhaler and oxygen dependent, former smoker (quit 2018),    breath sounds clear to auscultation       Cardiovascular hypertension, Pt. on home beta blockers and Pt. on medications (-) angina+ CAD, + CABG and + Peripheral Vascular Disease (5.6cm  AAA)  + dysrhythmias Atrial Fibrillation  Rhythm:Irregular Rate:Normal  4/18 ECHO: EF 55%, grade 2 diastolic dysfunction, no wall motion abnormalities, trace MR  10/19 Stress: no significant wall motion abnormalities, EF 43%   Neuro/Psych negative neurological ROS     GI/Hepatic negative GI ROS, Neg liver ROS,   Endo/Other  Hypothyroidism   Renal/GU negative Renal ROS     Musculoskeletal  (+) Arthritis ,   Abdominal   Peds  Hematology xarelto   Anesthesia Other Findings   Reproductive/Obstetrics                           Anesthesia Physical Anesthesia Plan  ASA: III  Anesthesia Plan: General   Post-op Pain Management:    Induction: Intravenous  PONV Risk Score and Plan: 2 and Ondansetron and Dexamethasone  Airway Management Planned: Oral ETT  Additional Equipment: Arterial line  Intra-op Plan:   Post-operative Plan: Possible Post-op intubation/ventilation  Informed Consent: I have reviewed the patients History and Physical, chart, labs and discussed the procedure including the risks, benefits and alternatives for the proposed anesthesia with the patient or authorized representative who has indicated his/her understanding  and acceptance.     Dental advisory given  Plan Discussed with: CRNA and Surgeon  Anesthesia Plan Comments: (See PAT note by Antionette PolesJames Burns, PA-C Plan routine monitors, A line, GETA)      Anesthesia Quick Evaluation

## 2018-05-07 ENCOUNTER — Inpatient Hospital Stay (HOSPITAL_COMMUNITY)
Admission: RE | Admit: 2018-05-07 | Discharge: 2018-05-08 | DRG: 269 | Disposition: A | Discharge status: Home Health | Payer: No Typology Code available for payment source | Attending: Vascular Surgery | Admitting: Vascular Surgery

## 2018-05-07 ENCOUNTER — Encounter (HOSPITAL_COMMUNITY): Payer: Self-pay

## 2018-05-07 ENCOUNTER — Inpatient Hospital Stay (HOSPITAL_COMMUNITY): Payer: No Typology Code available for payment source | Admitting: Anesthesiology

## 2018-05-07 ENCOUNTER — Other Ambulatory Visit: Payer: Self-pay

## 2018-05-07 ENCOUNTER — Encounter (HOSPITAL_COMMUNITY)
Admission: RE | Disposition: A | Discharge status: Home Health | Payer: Self-pay | Source: Home / Self Care | Attending: Vascular Surgery

## 2018-05-07 ENCOUNTER — Inpatient Hospital Stay (HOSPITAL_COMMUNITY): Payer: No Typology Code available for payment source

## 2018-05-07 ENCOUNTER — Inpatient Hospital Stay (HOSPITAL_COMMUNITY): Payer: No Typology Code available for payment source | Admitting: Physician Assistant

## 2018-05-07 DIAGNOSIS — Z7901 Long term (current) use of anticoagulants: Secondary | ICD-10-CM

## 2018-05-07 DIAGNOSIS — Z951 Presence of aortocoronary bypass graft: Secondary | ICD-10-CM | POA: Diagnosis not present

## 2018-05-07 DIAGNOSIS — E039 Hypothyroidism, unspecified: Secondary | ICD-10-CM | POA: Diagnosis present

## 2018-05-07 DIAGNOSIS — E785 Hyperlipidemia, unspecified: Secondary | ICD-10-CM | POA: Diagnosis present

## 2018-05-07 DIAGNOSIS — Z79899 Other long term (current) drug therapy: Secondary | ICD-10-CM

## 2018-05-07 DIAGNOSIS — I10 Essential (primary) hypertension: Secondary | ICD-10-CM | POA: Diagnosis present

## 2018-05-07 DIAGNOSIS — I739 Peripheral vascular disease, unspecified: Secondary | ICD-10-CM | POA: Diagnosis present

## 2018-05-07 DIAGNOSIS — J449 Chronic obstructive pulmonary disease, unspecified: Secondary | ICD-10-CM | POA: Diagnosis present

## 2018-05-07 DIAGNOSIS — I714 Abdominal aortic aneurysm, without rupture, unspecified: Secondary | ICD-10-CM | POA: Diagnosis present

## 2018-05-07 DIAGNOSIS — I2510 Atherosclerotic heart disease of native coronary artery without angina pectoris: Secondary | ICD-10-CM | POA: Diagnosis present

## 2018-05-07 DIAGNOSIS — Z9981 Dependence on supplemental oxygen: Secondary | ICD-10-CM | POA: Diagnosis not present

## 2018-05-07 DIAGNOSIS — Z9889 Other specified postprocedural states: Secondary | ICD-10-CM

## 2018-05-07 DIAGNOSIS — Z8249 Family history of ischemic heart disease and other diseases of the circulatory system: Secondary | ICD-10-CM

## 2018-05-07 DIAGNOSIS — Z8679 Personal history of other diseases of the circulatory system: Secondary | ICD-10-CM

## 2018-05-07 DIAGNOSIS — I4891 Unspecified atrial fibrillation: Secondary | ICD-10-CM | POA: Diagnosis present

## 2018-05-07 DIAGNOSIS — Z87891 Personal history of nicotine dependence: Secondary | ICD-10-CM

## 2018-05-07 HISTORY — PX: ABDOMINAL AORTIC ENDOVASCULAR STENT GRAFT: SHX5707

## 2018-05-07 LAB — CBC
HCT: 40 % (ref 39.0–52.0)
Hemoglobin: 12.7 g/dL — ABNORMAL LOW (ref 13.0–17.0)
MCH: 28.6 pg (ref 26.0–34.0)
MCHC: 31.8 g/dL (ref 30.0–36.0)
MCV: 90.1 fL (ref 80.0–100.0)
NRBC: 0 % (ref 0.0–0.2)
Platelets: 204 10*3/uL (ref 150–400)
RBC: 4.44 MIL/uL (ref 4.22–5.81)
RDW: 13.2 % (ref 11.5–15.5)
WBC: 7.7 10*3/uL (ref 4.0–10.5)

## 2018-05-07 LAB — APTT
aPTT: 31 seconds (ref 24–36)
aPTT: 34 seconds (ref 24–36)

## 2018-05-07 LAB — BASIC METABOLIC PANEL
Anion gap: 8 (ref 5–15)
BUN: 11 mg/dL (ref 8–23)
CO2: 24 mmol/L (ref 22–32)
CREATININE: 0.95 mg/dL (ref 0.61–1.24)
Calcium: 8.4 mg/dL — ABNORMAL LOW (ref 8.9–10.3)
Chloride: 108 mmol/L (ref 98–111)
GFR calc Af Amer: >60 mL/min (ref >60)
GFR calc non Af Amer: >60 mL/min (ref >60)
Glucose, Bld: 182 mg/dL — ABNORMAL HIGH (ref 70–99)
Potassium: 3.8 mmol/L (ref 3.5–5.1)
Sodium: 140 mmol/L (ref 135–145)

## 2018-05-07 LAB — POCT ACTIVATED CLOTTING TIME
Activated Clotting Time: 241 seconds
Activated Clotting Time: 241 seconds
Activated Clotting Time: 230 seconds

## 2018-05-07 LAB — PROTIME-INR
INR: 1.05
INR: 1.07
PROTHROMBIN TIME: 13.8 s (ref 11.4–15.2)
Prothrombin Time: 13.6 seconds (ref 11.4–15.2)

## 2018-05-07 LAB — MAGNESIUM: Magnesium: 1.7 mg/dL (ref 1.7–2.4)

## 2018-05-07 SURGERY — INSERTION, ENDOVASCULAR STENT GRAFT, AORTA, ABDOMINAL
Anesthesia: General | Site: Groin | Laterality: Bilateral

## 2018-05-07 MED ORDER — ATORVASTATIN CALCIUM 40 MG PO TABS
40.0000 mg | ORAL_TABLET | Freq: Every day | ORAL | Status: DC
  Administered 2018-05-07 – 2018-05-08 (×2): 40 mg via ORAL
  Filled 2018-05-07 (×2): qty 1

## 2018-05-07 MED ORDER — MIDAZOLAM HCL 2 MG/2ML IJ SOLN
INTRAMUSCULAR | Status: AC
  Filled 2018-05-07: qty 2

## 2018-05-07 MED ORDER — ALUM & MAG HYDROXIDE-SIMETH 200-200-20 MG/5ML PO SUSP
15.0000 mL | ORAL | Status: DC | PRN
  Filled 2018-05-07: qty 30

## 2018-05-07 MED ORDER — SODIUM CHLORIDE 0.9 % IV SOLN
INTRAVENOUS | Status: DC | PRN
  Administered 2018-05-07: 25 ug/min via INTRAVENOUS

## 2018-05-07 MED ORDER — PHENOL 1.4 % MT LIQD: 1.0000 | OROMUCOSAL | Status: DC | PRN

## 2018-05-07 MED ORDER — MAGNESIUM SULFATE 2 GM/50ML IV SOLN: 2.0000 g | Freq: Every day | INTRAVENOUS | Status: DC | PRN

## 2018-05-07 MED ORDER — CHLORHEXIDINE GLUCONATE CLOTH 2 % EX PADS: 6.0000 | MEDICATED_PAD | Freq: Once | CUTANEOUS | Status: DC

## 2018-05-07 MED ORDER — OXYCODONE-ACETAMINOPHEN 5-325 MG PO TABS
1.0000 | ORAL_TABLET | ORAL | Status: DC | PRN
  Administered 2018-05-07 (×2): 2 via ORAL
  Filled 2018-05-07 (×2): qty 2

## 2018-05-07 MED ORDER — TRAZODONE HCL 50 MG PO TABS
50.0000 mg | ORAL_TABLET | Freq: Every day | ORAL | Status: DC
  Administered 2018-05-07: 50 mg via ORAL
  Filled 2018-05-07: qty 1

## 2018-05-07 MED ORDER — MEPERIDINE HCL 50 MG/ML IJ SOLN: 6.2500 mg | INTRAMUSCULAR | Status: DC | PRN

## 2018-05-07 MED ORDER — ACETAMINOPHEN 325 MG PO TABS: 325.0000 mg | ORAL_TABLET | ORAL | Status: DC | PRN

## 2018-05-07 MED ORDER — SODIUM CHLORIDE 0.9 % IV SOLN
INTRAVENOUS | Status: AC
  Filled 2018-05-07 (×2): qty 1.2

## 2018-05-07 MED ORDER — CEFAZOLIN SODIUM-DEXTROSE 2-4 GM/100ML-% IV SOLN
INTRAVENOUS | Status: AC
  Filled 2018-05-07: qty 100

## 2018-05-07 MED ORDER — MORPHINE SULFATE (PF) 2 MG/ML IV SOLN: 2.0000 mg | INTRAVENOUS | Status: DC | PRN

## 2018-05-07 MED ORDER — PROPOFOL 10 MG/ML IV BOLUS
INTRAVENOUS | Status: AC
  Filled 2018-05-07: qty 20

## 2018-05-07 MED ORDER — SUGAMMADEX SODIUM 200 MG/2ML IV SOLN
INTRAVENOUS | Status: DC | PRN
  Administered 2018-05-07: 160.2 mg via INTRAVENOUS

## 2018-05-07 MED ORDER — DEXAMETHASONE SODIUM PHOSPHATE 10 MG/ML IJ SOLN
INTRAMUSCULAR | Status: DC | PRN
  Administered 2018-05-07: 10 mg via INTRAVENOUS

## 2018-05-07 MED ORDER — GUAIFENESIN-DM 100-10 MG/5ML PO SYRP: 15.0000 mL | ORAL_SOLUTION | ORAL | Status: DC | PRN

## 2018-05-07 MED ORDER — FENTANYL CITRATE (PF) 100 MCG/2ML IJ SOLN
25.0000 ug | INTRAMUSCULAR | Status: DC | PRN
  Administered 2018-05-07: 50 ug via INTRAVENOUS

## 2018-05-07 MED ORDER — LIDOCAINE 2% (20 MG/ML) 5 ML SYRINGE
INTRAMUSCULAR | Status: DC | PRN
  Administered 2018-05-07: 40 mg via INTRAVENOUS

## 2018-05-07 MED ORDER — FENTANYL CITRATE (PF) 100 MCG/2ML IJ SOLN
INTRAMUSCULAR | Status: AC
  Filled 2018-05-07: qty 2

## 2018-05-07 MED ORDER — LEVOTHYROXINE SODIUM 112 MCG PO TABS
112.0000 ug | ORAL_TABLET | Freq: Every day | ORAL | Status: DC
  Administered 2018-05-08: 112 ug via ORAL
  Filled 2018-05-07: qty 1

## 2018-05-07 MED ORDER — ROCURONIUM BROMIDE 10 MG/ML (PF) SYRINGE
PREFILLED_SYRINGE | INTRAVENOUS | Status: DC | PRN
  Administered 2018-05-07: 20 mg via INTRAVENOUS
  Administered 2018-05-07: 10 mg via INTRAVENOUS
  Administered 2018-05-07: 50 mg via INTRAVENOUS
  Administered 2018-05-07: 20 mg via INTRAVENOUS

## 2018-05-07 MED ORDER — LABETALOL HCL 5 MG/ML IV SOLN: 10.0000 mg | INTRAVENOUS | Status: DC | PRN

## 2018-05-07 MED ORDER — SODIUM CHLORIDE 0.9 % IV SOLN
INTRAVENOUS | Status: DC
  Administered 2018-05-07 (×3): via INTRAVENOUS

## 2018-05-07 MED ORDER — CEFAZOLIN SODIUM-DEXTROSE 2-4 GM/100ML-% IV SOLN
2.0000 g | Freq: Three times a day (TID) | INTRAVENOUS | Status: AC
  Administered 2018-05-07 – 2018-05-08 (×2): 2 g via INTRAVENOUS
  Filled 2018-05-07 (×2): qty 100

## 2018-05-07 MED ORDER — CEFAZOLIN SODIUM-DEXTROSE 2-4 GM/100ML-% IV SOLN
2.0000 g | INTRAVENOUS | Status: AC
  Administered 2018-05-07: 2 g via INTRAVENOUS

## 2018-05-07 MED ORDER — DOCUSATE SODIUM 100 MG PO CAPS
100.0000 mg | ORAL_CAPSULE | Freq: Every day | ORAL | Status: DC
  Filled 2018-05-07 (×2): qty 1

## 2018-05-07 MED ORDER — SODIUM CHLORIDE 0.9 % IV SOLN: INTRAVENOUS | Status: DC

## 2018-05-07 MED ORDER — FENTANYL CITRATE (PF) 250 MCG/5ML IJ SOLN
INTRAMUSCULAR | Status: AC
  Filled 2018-05-07: qty 5

## 2018-05-07 MED ORDER — SODIUM CHLORIDE 0.9 % IV SOLN
INTRAVENOUS | Status: DC | PRN
  Administered 2018-05-07 (×2): 500 mL

## 2018-05-07 MED ORDER — HEPARIN SODIUM (PORCINE) 1000 UNIT/ML IJ SOLN
INTRAMUSCULAR | Status: DC | PRN
  Administered 2018-05-07: 2000 [IU] via INTRAVENOUS
  Administered 2018-05-07: 8000 [IU] via INTRAVENOUS

## 2018-05-07 MED ORDER — MIDAZOLAM HCL 5 MG/5ML IJ SOLN
INTRAMUSCULAR | Status: DC | PRN
  Administered 2018-05-07 (×2): 1 mg via INTRAVENOUS

## 2018-05-07 MED ORDER — IODIXANOL 320 MG/ML IV SOLN
INTRAVENOUS | Status: DC | PRN
  Administered 2018-05-07: 150 mL via INTRAVENOUS

## 2018-05-07 MED ORDER — SODIUM CHLORIDE 0.9 % IV SOLN: 500.0000 mL | Freq: Once | INTRAVENOUS | Status: DC | PRN

## 2018-05-07 MED ORDER — ONDANSETRON HCL 4 MG/2ML IJ SOLN
INTRAMUSCULAR | Status: DC | PRN
  Administered 2018-05-07: 4 mg via INTRAVENOUS

## 2018-05-07 MED ORDER — METOPROLOL SUCCINATE ER 100 MG PO TB24
100.0000 mg | ORAL_TABLET | Freq: Every day | ORAL | Status: DC
  Administered 2018-05-08: 100 mg via ORAL
  Filled 2018-05-07 (×2): qty 1

## 2018-05-07 MED ORDER — PROMETHAZINE HCL 25 MG/ML IJ SOLN: 6.2500 mg | INTRAMUSCULAR | Status: DC | PRN

## 2018-05-07 MED ORDER — PROPOFOL 10 MG/ML IV BOLUS
INTRAVENOUS | Status: DC | PRN
  Administered 2018-05-07: 70 mg via INTRAVENOUS

## 2018-05-07 MED ORDER — ONDANSETRON HCL 4 MG/2ML IJ SOLN
4.0000 mg | Freq: Four times a day (QID) | INTRAMUSCULAR | Status: DC | PRN
  Filled 2018-05-07: qty 2

## 2018-05-07 MED ORDER — 0.9 % SODIUM CHLORIDE (POUR BTL) OPTIME
TOPICAL | Status: DC | PRN
  Administered 2018-05-07: 1000 mL

## 2018-05-07 MED ORDER — PANTOPRAZOLE SODIUM 40 MG PO TBEC
40.0000 mg | DELAYED_RELEASE_TABLET | Freq: Every day | ORAL | Status: DC
  Administered 2018-05-07 – 2018-05-08 (×2): 40 mg via ORAL
  Filled 2018-05-07 (×2): qty 1

## 2018-05-07 MED ORDER — FUROSEMIDE 20 MG PO TABS
20.0000 mg | ORAL_TABLET | Freq: Every day | ORAL | Status: DC
  Administered 2018-05-07 – 2018-05-08 (×2): 20 mg via ORAL
  Filled 2018-05-07 (×2): qty 1

## 2018-05-07 MED ORDER — PHENYLEPHRINE 40 MCG/ML (10ML) SYRINGE FOR IV PUSH (FOR BLOOD PRESSURE SUPPORT)
PREFILLED_SYRINGE | INTRAVENOUS | Status: DC | PRN
  Administered 2018-05-07 (×2): 40 ug via INTRAVENOUS
  Administered 2018-05-07: 80 ug via INTRAVENOUS
  Administered 2018-05-07: 40 ug via INTRAVENOUS

## 2018-05-07 MED ORDER — ACETAMINOPHEN 325 MG RE SUPP: 325.0000 mg | RECTAL | Status: DC | PRN

## 2018-05-07 MED ORDER — MIDAZOLAM HCL 2 MG/2ML IJ SOLN: 0.5000 mg | Freq: Once | INTRAMUSCULAR | Status: DC | PRN

## 2018-05-07 MED ORDER — LACTATED RINGERS IV SOLN
INTRAVENOUS | Status: DC | PRN
  Administered 2018-05-07: 07:00:00 via INTRAVENOUS

## 2018-05-07 MED ORDER — HEPARIN SODIUM (PORCINE) 5000 UNIT/ML IJ SOLN
5000.0000 [IU] | Freq: Three times a day (TID) | INTRAMUSCULAR | Status: DC
  Administered 2018-05-07 – 2018-05-08 (×2): 5000 [IU] via SUBCUTANEOUS
  Filled 2018-05-07 (×2): qty 1

## 2018-05-07 MED ORDER — HYDRALAZINE HCL 20 MG/ML IJ SOLN: 5.0000 mg | INTRAMUSCULAR | Status: DC | PRN

## 2018-05-07 MED ORDER — PROTAMINE SULFATE 10 MG/ML IV SOLN
INTRAVENOUS | Status: DC | PRN
  Administered 2018-05-07: 40 mg via INTRAVENOUS
  Administered 2018-05-07: 10 mg via INTRAVENOUS

## 2018-05-07 MED ORDER — FENTANYL CITRATE (PF) 100 MCG/2ML IJ SOLN
INTRAMUSCULAR | Status: DC | PRN
  Administered 2018-05-07: 50 ug via INTRAVENOUS
  Administered 2018-05-07: 25 ug via INTRAVENOUS
  Administered 2018-05-07: 100 ug via INTRAVENOUS

## 2018-05-07 MED ORDER — METOPROLOL TARTRATE 5 MG/5ML IV SOLN: 2.0000 mg | INTRAVENOUS | Status: DC | PRN

## 2018-05-07 MED ORDER — POTASSIUM CHLORIDE CRYS ER 20 MEQ PO TBCR
20.0000 meq | EXTENDED_RELEASE_TABLET | Freq: Every day | ORAL | Status: AC | PRN
  Administered 2018-05-08: 20 meq via ORAL
  Filled 2018-05-07: qty 1

## 2018-05-07 MED ORDER — LACTATED RINGERS IV SOLN
INTRAVENOUS | Status: DC | PRN
  Administered 2018-05-07 (×2): via INTRAVENOUS

## 2018-05-07 SURGICAL SUPPLY — 65 items
BLADE CLIPPER SURG (BLADE) ×2 IMPLANT
CANISTER SUCT 3000ML PPV (MISCELLANEOUS) ×2 IMPLANT
CANNULA VESSEL W/WING (CANNULA) ×2 IMPLANT
CATH ACCU-VU SIZ PIG 5F 70CM (CATHETERS) IMPLANT
CATH ANGIO 5F BER2 65CM (CATHETERS) ×2 IMPLANT
CATH BEACON 5 .035 65 KMP TIP (CATHETERS) ×2 IMPLANT
CATH BEACON 5.038 65CM KMP-01 (CATHETERS) ×2 IMPLANT
CATH OMNI FLUSH .035X70CM (CATHETERS) ×2 IMPLANT
CATH VANSCH 5FR 6CM (CATHETERS) ×2 IMPLANT
CLIP VESOCCLUDE MED 6/CT (CLIP) ×2 IMPLANT
CLIP VESOCCLUDE SM WIDE 6/CT (CLIP) ×2 IMPLANT
COVER PROBE W GEL 5X96 (DRAPES) ×2 IMPLANT
COVER WAND RF STERILE (DRAPES) ×2 IMPLANT
DERMABOND ADVANCED (GAUZE/BANDAGES/DRESSINGS) ×2
DERMABOND ADVANCED .7 DNX12 (GAUZE/BANDAGES/DRESSINGS) ×2 IMPLANT
DEVICE CLOSURE PERCLS PRGLD 6F (VASCULAR PRODUCTS) ×4 IMPLANT
DEVICE TORQUE KENDALL .025-038 (MISCELLANEOUS) ×2 IMPLANT
DRSG TEGADERM 2-3/8X2-3/4 SM (GAUZE/BANDAGES/DRESSINGS) ×4 IMPLANT
ELECT CAUTERY BLADE 6.4 (BLADE) ×2 IMPLANT
ELECT REM PT RETURN 9FT ADLT (ELECTROSURGICAL) ×2
ELECTRODE REM PT RTRN 9FT ADLT (ELECTROSURGICAL) ×1 IMPLANT
EXCLUDER TNK LEG 35MX14X14 (Endovascular Graft) ×1 IMPLANT
EXCLUDER TRUNK LEG 35MX14X14 (Endovascular Graft) ×2 IMPLANT
GAUZE SPONGE 2X2 8PLY STRL LF (GAUZE/BANDAGES/DRESSINGS) ×2 IMPLANT
GLOVE BIO SURGEON STRL SZ7.5 (GLOVE) ×2 IMPLANT
GLOVE BIOGEL PI IND STRL 8 (GLOVE) ×1 IMPLANT
GLOVE BIOGEL PI INDICATOR 8 (GLOVE) ×1
GOWN STRL REUS W/ TWL LRG LVL3 (GOWN DISPOSABLE) ×4 IMPLANT
GOWN STRL REUS W/TWL LRG LVL3 (GOWN DISPOSABLE) ×4
GRAFT BALLN CATH 65CM (STENTS) ×1 IMPLANT
GUIDEWIRE ANGLED .035X150CM (WIRE) ×2 IMPLANT
KIT BASIN OR (CUSTOM PROCEDURE TRAY) ×2 IMPLANT
KIT TURNOVER KIT B (KITS) ×2 IMPLANT
LEG CONTRALATERAL 16X20X9.5 (Endovascular Graft) ×1 IMPLANT
LEG CONTRALATERAL 23X10 (Vascular Products) ×2 IMPLANT
LOOP VESSEL MAXI BLUE (MISCELLANEOUS) ×2 IMPLANT
LOOP VESSEL MINI RED (MISCELLANEOUS) ×2 IMPLANT
NEEDLE PERC 18GX7CM (NEEDLE) ×2 IMPLANT
NS IRRIG 1000ML POUR BTL (IV SOLUTION) ×2 IMPLANT
PACK ENDOVASCULAR (PACKS) ×2 IMPLANT
PAD ARMBOARD 7.5X6 YLW CONV (MISCELLANEOUS) ×4 IMPLANT
PENCIL BUTTON HOLSTER BLD 10FT (ELECTRODE) ×2 IMPLANT
PERCLOSE PROGLIDE 6F (VASCULAR PRODUCTS) ×8
SET MICROPUNCTURE 5F STIFF (MISCELLANEOUS) ×2 IMPLANT
SHEATH AVANTI 11CM 8FR (SHEATH) ×2 IMPLANT
SHEATH BRITE TIP 8FR 23CM (SHEATH) ×2 IMPLANT
SHEATH PINNACLE 8F 10CM (SHEATH) IMPLANT
SHIELD RADPAD SCOOP 12X17 (MISCELLANEOUS) ×4 IMPLANT
SPONGE GAUZE 2X2 STER 10/PKG (GAUZE/BANDAGES/DRESSINGS) ×2
STAPLER VISISTAT (STAPLE) IMPLANT
STENT GRAFT BALLN CATH 65CM (STENTS) ×1
STENT GRAFT CONTRALAT 16X20X9. (Endovascular Graft) ×1 IMPLANT
STOPCOCK MORSE 400PSI 3WAY (MISCELLANEOUS) ×4 IMPLANT
SUT PROLENE 5 0 C 1 24 (SUTURE) ×4 IMPLANT
SUT PROLENE 6 0 BV (SUTURE) ×2 IMPLANT
SUT SILK 3 0 (SUTURE) ×1
SUT SILK 3-0 18XBRD TIE 12 (SUTURE) ×1 IMPLANT
SUT VICRYL 4-0 PS2 18IN ABS (SUTURE) ×4 IMPLANT
SYR 30ML LL (SYRINGE) ×2 IMPLANT
SYR BULB IRRIGATION 50ML (SYRINGE) ×2 IMPLANT
TOWEL GREEN STERILE (TOWEL DISPOSABLE) ×2 IMPLANT
TRAY FOLEY MTR SLVR 16FR STAT (SET/KITS/TRAYS/PACK) ×2 IMPLANT
TUBING HIGH PRESSURE 120CM (CONNECTOR) ×2 IMPLANT
WIRE AMPLATZ SS-J .035X180CM (WIRE) ×4 IMPLANT
WIRE BENTSON .035X145CM (WIRE) ×4 IMPLANT

## 2018-05-07 NOTE — Progress Notes (Signed)
Pt received from PACU. Pt has level 0 groin incisions bilat. Aline. Vitals stable. Telebox 4E06 applied CCMD notified. CHG bath given. Pt denies any complaints. Lucas Garcia

## 2018-05-07 NOTE — Anesthesia Procedure Notes (Signed)
Procedure Name: Intubation Date/Time: 05/07/2018 7:38 AM Performed by: Lovie Cholock, Sherlene Rickel K, CRNA Pre-anesthesia Checklist: Patient identified, Emergency Drugs available, Suction available and Patient being monitored Patient Re-evaluated:Patient Re-evaluated prior to induction Oxygen Delivery Method: Circle System Utilized Preoxygenation: Pre-oxygenation with 100% oxygen Induction Type: IV induction Ventilation: Mask ventilation without difficulty Laryngoscope Size: Miller and 2 Grade View: Grade I Tube type: Oral Tube size: 7.5 mm Number of attempts: 1 Airway Equipment and Method: Stylet and Oral airway Placement Confirmation: ETT inserted through vocal cords under direct vision,  positive ETCO2 and breath sounds checked- equal and bilateral Secured at: 23 cm Tube secured with: Tape Dental Injury: Teeth and Oropharynx as per pre-operative assessment

## 2018-05-07 NOTE — Transfer of Care (Signed)
Immediate Anesthesia Transfer of Care Note  Patient: Lucas Garcia  Procedure(s) Performed: ABDOMINAL AORTIC ENDOVASCULAR STENT GRAFT (Bilateral Groin)  Patient Location: PACU  Anesthesia Type:General  Level of Consciousness: awake, oriented and patient cooperative  Airway & Oxygen Therapy: Patient Spontanous Breathing and Patient connected to face mask oxygen  Post-op Assessment: Report given to RN and Post -op Vital signs reviewed and stable  Post vital signs: Reviewed  Last Vitals:  Vitals Value Taken Time  BP 128/73 05/07/2018 10:46 AM  Temp    Pulse 94 05/07/2018 10:55 AM  Resp 11 05/07/2018 10:55 AM  SpO2 97 % 05/07/2018 10:55 AM  Vitals shown include unvalidated device data.  Last Pain:  Vitals:   05/07/18 0627  TempSrc: Oral  PainSc: 0-No pain         Complications: No apparent anesthesia complications

## 2018-05-07 NOTE — Op Note (Signed)
NAME: Tammy SoursJoseph D Pitner    MRN: [id number] DOB: 11/08/1945    DATE OF OPERATION: 05/07/2018  PREOP DIAGNOSIS:    5.6 cm infrarenal abdominal aortic aneurysm  POSTOP DIAGNOSIS:    Same  PROCEDURE:    1.  Pre-closure of bilateral common femoral arteries 2.  Aortogram and bilateral iliac arteriogram 3.  Percutaneous endovascular aneurysm repair using 3 components  SURGEON: Di Kindlehristopher S. Edilia Boickson, MD, FACS  ASSIST: Aggie MoatsMatt Eveland, PA  ANESTHESIA: General  EBL: Minimal  INDICATIONS:    Tammy SoursJoseph D Arreaga is a 73 y.o. male who presented with a 5.6 cm infrarenal abdominal aortic aneurysm.  He underwent preoperative cardiac clearance and presents for elective repair.  FINDINGS:   Completion arteriogram showed excellent position of the graft with no type I endoleak.  There appeared to be a small type II endoleak.  Both renal arteries are patent and both hypogastric arteries are patent.  TECHNIQUE:   The patient was taken to the operating room and received a general anesthetic.  Arterial line had been placed by anesthesia.  The abdomen and groins were prepped and draped in usual sterile fashion.  On the right side, under ultrasound guidance, the right common femoral artery was cannulated with a micropuncture needle just above a slightly ectatic segment.  The micropuncture sheath was introduced over wire and then a Bentson wire introduced under fluoroscopy into the infrarenal aorta.  The tract over this wire was dilated with an 8 [place]French dilator.  The first Perclose device was rotated 5 to 10 degrees medially.  The second Perclose device was rotated 5 to 10 degrees laterally.  Short 8 French sheath was placed on the right side.  On the left side, under ultrasound guidance, the left common femoral artery was cannulated with a micropuncture needle and a micropuncture sheath introduced over the wire.  A Bentson wire was then placed under fluoroscopy into the infrarenal aorta.  The tract over the  wire was dilated and then the first Perclose device was placed rotated for 5 to 10 degrees medially.  The second Perclose device was rotated 5 to 10 degrees laterally.  A long 8 French sheath was placed on the left side.  The patient was then heparinized.  ACT was monitored throughout the procedure.  On the right side I exchanged the Bentson wire for an Amplatz wire using the Omni Flush catheter.  The position in the aorta was confirmed so that the wire would not advance too far.  Likewise an Amplatz wire was placed on the left side.  I then exchanged the 8 French sheath for a 14 French sheath on the left side.  The pigtail catheter was then placed from the left side.  On the right side exchanged the 8 [place]French sheath for the 18 French sheath.  The main body which was a 35 mm x 14 mm x 14 cm device was advanced through the sheath on the right with the gate crossed.  The sheath was then retracted.  Aortogram was obtained with cranial angulation to best identify the neck.  The level of the lowest left renal artery was identified.  The graft was then deployed and was slightly low.  Therefore, the proximal aspect of the graft was constrained and then advanced slightly and then reopened and was in excellent position.  Attention was then turned to cannulation of the contralateral gate.  Cannulation was difficult and took some time but ultimately I was able to cannulate the gate using a  guide catheter and angled Glidewire.  The Amplatz wire was then advanced and then a pigtail catheter positioned within the graft the Amplatz wire retracted.  The catheter was turned within the graft to be sure that we were intraluminal.  Next the contralateral limb was placed after we shot a retrograde arteriogram with an RAO projection.  This demonstrated the position of the hypogastric artery.  A 23 mm x 10 cm device was placed into the contralateral gate and deployed without difficulty.  Next the main body was fully deployed.  A  retrograde arteriogram was obtained on the right with an LAO projection and a 28 mm x 10 cm extension graft was selected.  This was positioned above the hypogastric artery and deployed without difficulty.  Next the proximal graft, overlap regions and distal graft were ballooned with the mob balloon.  Completion film was then obtained which showed an excellent result.  Both renal arteries were patent.  The hypogastric arteries were patent.  There is no type I endoleak.  Next the left femoral sheath was retracted and the Perclose devices was secured.  There appeared to be good hemostasis so the wire was removed.  The Perclose devices were secured and then sutures pulled up and hemostat applied for pressure.  On the right side the sheath was retracted over a Bentson wire.  The Perclose devices were secured.  There was minimal oozing.  The wire was then removed.  The Perclose devices were then secured and again the sutures clamped with a hemostat provide some pressure.  On the left side the sutures were cut and the skin closed with a 4-0 Monocryl.  On the right side the sutures were cut and the skin closed with a 4-0 Monocryl.  Patient tolerated the procedure well was transferred to the recovery room in stable condition.  All needle and sponge counts were correct.  Waverly Ferrarihristopher Kayler Rise, MD, FACS [facility]  DATE OF DICTATION:   05/07/2018

## 2018-05-07 NOTE — Anesthesia Postprocedure Evaluation (Signed)
Anesthesia Post Note  Patient: Lucas Garcia  Procedure(s) Performed: ABDOMINAL AORTIC ENDOVASCULAR STENT GRAFT (Bilateral Groin)     Patient location during evaluation: PACU Anesthesia Type: General Level of consciousness: sedated, patient cooperative and oriented Pain management: pain level controlled Vital Signs Assessment: post-procedure vital signs reviewed and stable Respiratory status: spontaneous breathing, nonlabored ventilation, respiratory function stable and patient connected to nasal cannula oxygen Cardiovascular status: blood pressure returned to baseline and stable Postop Assessment: no apparent nausea or vomiting Anesthetic complications: no    Last Vitals:  Vitals:   05/07/18 1319 05/07/18 1400  BP: 128/84   Pulse:  82  Resp: 20 15  Temp:    SpO2:  93%    Last Pain:  Vitals:   05/07/18 1400  TempSrc:   PainSc: 6                  Zeek Rostron,E. Mittie Knittel

## 2018-05-07 NOTE — Anesthesia Procedure Notes (Addendum)
Arterial Line Insertion Start/End1/27/2020 7:15 AM, 05/07/2018 7:31 AM Performed by: Adair Laundry, CRNA  Patient location: Pre-op. Preanesthetic checklist: patient identified, IV checked, surgical consent and monitors and equipment checked Lidocaine 1% used for infiltration and patient sedated Right, radial was placed Catheter size: 20 G Hand hygiene performed , maximum sterile barriers used  and Seldinger technique used  Attempts: 3 Procedure performed without using ultrasound guided technique. Following insertion, dressing applied and Biopatch. Post procedure assessment: normal  Patient tolerated the procedure well with no immediate complications. Additional procedure comments: Right Radial attempt x 2 SRNA.

## 2018-05-07 NOTE — H&P (Signed)
REASON FOR ADMISSION:    5.6 cm infrarenal abdominal aortic aneurysm  HPI:   Lucas Garcia is a pleasant 73 y.o. male, who I last saw on 03/28/2018.  He had been referred with a 5.6 cm infrarenal abdominal aortic aneurysm.  This had enlarged from 5 cm.  The patient has significant pulmonary issues and is on home O2.  He also has coronary artery disease he underwent coronary revascularization in 2002.  He is on Xarelto for atrial fibrillation.  He was cleared by his [facility]ologist at the [facility].  I felt he would be at very high risk for open repair.  Fortunately he appeared to be a reasonable candidate for endovascular repair.  He has not had any recent abdominal pain or back pain.  Past Medical History:  Diagnosis Date  . A-fib (HCC)   . COPD (chronic obstructive pulmonary disease) (HCC)   . Coronary artery disease   . Hyperlipidemia   . Hypertension   . Hypothyroidism   . Thyroid disease     Family History  Problem Relation Age of Onset  . Alzheimer's disease Mother   . Heart disease Father     SOCIAL HISTORY: Social History   Socioeconomic History  . Marital status: Married    Spouse name: Not on file  . Number of children: Not on file  . Years of education: Not on file  . Highest education level: Not on file  Occupational History  . Not on file  Social Needs  . Financial resource strain: Not on file  . Food insecurity:    Worry: Not on file    Inability: Not on file  . Transportation needs:    Medical: Not on file    Non-medical: Not on file  Tobacco Use  . Smoking status: Former Smoker    Packs/day: 0.25    Years: 58.00    Pack years: 14.50    Types: Cigarettes    Last attempt to quit: 04/11/2016    Years since quitting: 2.0  . Smokeless tobacco: Never Used  Substance and Sexual Activity  . Alcohol use: No  . Drug use: No  . Sexual activity: Not on file  Lifestyle  . Physical activity:    Days per week: Not on file    Minutes per session: Not on file    . Stress: Not on file  Relationships  . Social connections:    Talks on phone: Not on file    Gets together: Not on file    Attends religious service: Not on file    Active member of club or organization: Not on file    Attends meetings of clubs or organizations: Not on file    Relationship status: Not on file  . Intimate partner violence:    Fear of current or ex partner: Not on file    Emotionally abused: Not on file    Physically abused: Not on file    Forced sexual activity: Not on file  Other Topics Concern  . Not on file  Social History Narrative  . Not on file    No Known Allergies  Current Facility-Administered Medications  Medication Dose Route Frequency Provider Last Rate Last Dose  . 0.9 %  sodium chloride infusion   Intravenous Continuous Chuck Hintickson,  S, MD      . 0.9 % irrigation (POUR BTL)    PRN Chuck Hintickson,  S, MD   1,000 mL at 05/07/18 0715  . ceFAZolin (ANCEF) 2-4 GM/100ML-% IVPB           .  ceFAZolin (ANCEF) IVPB 2g/100 mL premix  2 g Intravenous 30 min Pre-Op Chuck Hint, MD      . Chlorhexidine Gluconate Cloth 2 % PADS 6 each  6 each Topical Once Chuck Hint, MD       And  . Chlorhexidine Gluconate Cloth 2 % PADS 6 each  6 each Topical Once Chuck Hint, MD      . heparin 6,000 Units in sodium chloride 0.9 % 500 mL irrigation    PRN Chuck Hint, MD   500 mL at 05/07/18 0716  . iodixanol (VISIPAQUE) 320 MG/ML injection    PRN Chuck Hint, MD   150 mL at 05/07/18 4132    REVIEW OF SYSTEMS:  [X]  denotes positive finding, [ ]  denotes negative finding Cardiac  Comments:  Chest pain or chest pressure:    Shortness of breath upon exertion: x   Short of breath when lying flat: x   Irregular heart rhythm: x       Vascular    Pain in calf, thigh, or hip brought on by ambulation:    Pain in feet at night that wakes you up from your sleep:     Blood clot in your veins:    Leg swelling:          Pulmonary    Oxygen at home: x   Productive cough:     Wheezing:  x       Neurologic    Sudden weakness in arms or legs:     Sudden numbness in arms or legs:     Sudden onset of difficulty speaking or slurred speech:    Temporary loss of vision in one eye:     Problems with dizziness:         Gastrointestinal    Blood in stool:     Vomited blood:         Genitourinary    Burning when urinating:     Blood in urine:        Psychiatric    Major depression:         Hematologic    Bleeding problems:    Problems with blood clotting too easily:        Skin    Rashes or ulcers:        Constitutional    Fever or chills:     PHYSICAL EXAM:   Vitals:   05/07/18 0627 05/07/18 0636  BP:  (!) 169/111  Pulse: 90   Resp: 20   Temp: 97.6 F (36.4 C)   TempSrc: Oral   SpO2: 96%   Weight: 80.1 kg   Height: 5\' 6"  (1.676 m)    GENERAL: The patient is a well-nourished male, in no acute distress. The vital signs are documented above. CARDIAC: There is a regular rate and rhythm.  VASCULAR: I do not detect carotid bruits.  I cannot palpate pedal pulses however he has a monophasic dorsalis pedis and posterior tibial signal bilaterally. PULMONARY: There is good air exchange bilaterally without wheezing or rales. ABDOMEN: Soft and non-tender with normal pitched bowel sounds.  Because of his size I cannot palpate his aneurysm. MUSCULOSKELETAL: There are no major deformities or cyanosis. NEUROLOGIC: No focal weakness or paresthesias are detected. SKIN: There are no ulcers or rashes noted. PSYCHIATRIC: The patient has a normal affect.  DATA:    CT ANGIOGRAM: The CT angiogram shows a 5.6 cm infrarenal abdominal aortic aneurysm.  I have reviewed his  films carefully I think he is a candidate for endovascular repair.  Neck measures 40 mm in length.  He does have a somewhat ectatic right common femoral artery.   ASSESSMENT & PLAN:   5.6 CM INFRARENAL ABDOMINAL AORTIC ANEURYSM: The  patient presents for endovascular repair of his aneurysm.  I would likely do a cutdown on the right given that the right common femoral artery is ectatic. I have discussed the indications for aneurysm repair. I have explained that without repair, the risk of the aneurysm rupturing is 5-10% per year. We have discussed the advantages and disadvantages of open versus endovascular repair. The patient wishes to proceed with endovascular aneurysm repair (EVAR). I have discussed the potential complications of EVAR, including, but not limited to: bleeding, infection, arterial injury, graft migration, endoleak, renal failure, MI or other unpredictable medical problems. We have discussed the possibility of having to convert to open repair. We also discussed the need for continued lifelong follow-up after EVAR. All of the patients questions were answered and they are agreeable to proceed with surgery.   Waverly Ferrari [facility] [phone]

## 2018-05-08 ENCOUNTER — Other Ambulatory Visit: Payer: Self-pay | Admitting: *Deleted

## 2018-05-08 ENCOUNTER — Encounter (HOSPITAL_COMMUNITY): Payer: Self-pay | Admitting: Vascular Surgery

## 2018-05-08 DIAGNOSIS — I714 Abdominal aortic aneurysm, without rupture, unspecified: Secondary | ICD-10-CM

## 2018-05-08 LAB — BASIC METABOLIC PANEL
Anion gap: 8 (ref 5–15)
BUN: 12 mg/dL (ref 8–23)
CO2: 24 mmol/L (ref 22–32)
Calcium: 8.2 mg/dL — ABNORMAL LOW (ref 8.9–10.3)
Chloride: 108 mmol/L (ref 98–111)
Creatinine, Ser: 0.97 mg/dL (ref 0.61–1.24)
GFR calc Af Amer: >60 mL/min (ref >60)
GFR calc non Af Amer: >60 mL/min (ref >60)
Glucose, Bld: 166 mg/dL — ABNORMAL HIGH (ref 70–99)
Potassium: 3.4 mmol/L — ABNORMAL LOW (ref 3.5–5.1)
Sodium: 140 mmol/L (ref 135–145)

## 2018-05-08 LAB — CBC
HCT: 35.7 % — ABNORMAL LOW (ref 39.0–52.0)
Hemoglobin: 11.5 g/dL — ABNORMAL LOW (ref 13.0–17.0)
MCH: 28.5 pg (ref 26.0–34.0)
MCHC: 32.2 g/dL (ref 30.0–36.0)
MCV: 88.6 fL (ref 80.0–100.0)
Platelets: 166 10*3/uL (ref 150–400)
RBC: 4.03 MIL/uL — ABNORMAL LOW (ref 4.22–5.81)
RDW: 13.1 % (ref 11.5–15.5)
WBC: 8.7 10*3/uL (ref 4.0–10.5)
nRBC: 0 % (ref 0.0–0.2)

## 2018-05-08 MED ORDER — OXYCODONE-ACETAMINOPHEN 5-325 MG PO TABS: 1.0000 | ORAL_TABLET | Freq: Four times a day (QID) | ORAL | 0 refills | Status: AC | PRN

## 2018-05-08 MED ORDER — POTASSIUM CHLORIDE CRYS ER 10 MEQ PO TBCR
10.0000 meq | EXTENDED_RELEASE_TABLET | Freq: Every day | ORAL | Status: DC
  Administered 2018-05-08: 10 meq via ORAL
  Filled 2018-05-08: qty 1

## 2018-05-08 MED ORDER — OXYCODONE-ACETAMINOPHEN 5-325 MG PO TABS: 1.0000 | ORAL_TABLET | Freq: Four times a day (QID) | ORAL | 0 refills | Status: DC | PRN

## 2018-05-08 NOTE — Progress Notes (Signed)
Pt educated and provided discharge instructions. Pt telebox removed/ccmd notified. IV removed and intact. Pt denies any complaints. Has voided well. Vitals stable. Wife has pt belongings and will meet at [employer] for ride. Pt tx via volunteers to meet wife. Lacy Duverney, RN

## 2018-05-08 NOTE — Care Management Note (Signed)
Case Management Note Donn PieriniKristi Channing Yeager RN, BSN Transitions of Care Unit 4E- RN Case Manager [phone]  Patient Details  Name: Lucas SoursJoseph D Leavens MRN: [id number] Date of Birth: 11/24/1945  Subjective/Objective:    Pt admitted s/p AAA repair                Action/Plan: PTA pt lived at home with wife, plan to return home, notified by Elmarie Shileyiffany with [facility]- CM spoke with pt at beside- list provided Per CMS guidelines from medicare.gov website with star ratings (copy placed in shadow chart)- pt reports that he was contacted by [facility] pre-op and has # at home to contact them- pt verbalized he is ok with using them for any HH needs- Pt states his [facility] and he [facility] home [facility] with [facility]. No other CM needs noted will let Tiffany with [facility] know of transition to home today .  Expected Discharge Date:  05/08/18               Expected Discharge Plan:  Home w Home Health Services  In-House Referral:     Discharge planning Services  CM Consult  Post Acute Care Choice:  Home Health Choice offered to:     DME Arranged:    DME Agency:     HH Arranged:    HH Agency:  [facility]  Status of Service:  Completed, signed off  If discussed at Long Length of Stay Meetings, dates discussed:    Discharge Disposition: home/home health   Additional Comments:  Darrold SpanWebster, Ayvin Lipinski Hall, RN 05/08/2018, 11:07 AM

## 2018-05-08 NOTE — Progress Notes (Signed)
Patient ambulated in hall approx. 40 ft tolerated fair

## 2018-05-08 NOTE — Progress Notes (Signed)
[facility] [place]  Subjective  - Doing fine walked this am.   Objective 121/78 (!) 115 97.9 F (36.6 C) (Oral) (!) 23 96%  Intake/Output Summary (Last 24 hours) at 05/08/2018 0725 Last data filed at 05/08/2018 0600 Gross per 24 hour  Intake 3162.09 ml  Output 545 ml  Net 2617.09 ml    Feet warm and well perfused Groins soft without hematoma Abdomin soft, NTTP no lumbar pain Heart A fib Lungs non labored breathing  Assessment/Planning: POD # 1 EVAR  He has not voided.  Once he has independently voided and tolerates breakfast he will be discharged home later today. He will get Xarelto today Pain medication sent to his pharmacy K+ 3.4 will give Potassium  Mosetta Pigeon 05/08/2018 7:25 AM --  Laboratory Lab Results: Recent Labs    05/07/18 1110 05/08/18 0456  WBC 7.7 8.7  HGB 12.7* 11.5*  HCT 40.0 35.7*  PLT 204 166   BMET Recent Labs    05/07/18 1110 05/08/18 0456  NA 140 140  K 3.8 3.4*  CL 108 108  CO2 24 24  GLUCOSE 182* 166*  BUN 11 12  CREATININE 0.95 0.97  CALCIUM 8.4* 8.2*    COAG Lab Results  Component Value Date   INR 1.07 05/07/2018   INR 1.05 05/07/2018   No results found for: PTT

## 2018-05-08 NOTE — Discharge Instructions (Signed)
   [facility] [place]   Discharge Instructions  Endovascular Aortic Aneurysm Repair  Please refer to the following instructions for your post-procedure care. Your surgeon or Physician Assistant will discuss any changes with you.  Activity  You are encouraged to walk as much as you can. You can slowly return to normal activities but must avoid strenuous activity and heavy lifting until your doctor tells you it's OK. Avoid activities such as vacuuming or swinging a gold club. It is normal to feel tired for several weeks after your surgery. Do not drive until your doctor gives the OK and you are no longer taking prescription pain medications. It is also normal to have difficulty with sleep habits, eating, and bowel movements after surgery. These will go away with time.  Bathing/Showering  You may shower after you go home. If you have an incision, do not soak in a bathtub, hot tub, or swim until the incision heals completely.  Incision Care  Shower every day. Clean your incision with mild soap and water. Pat the area dry with a clean towel. You do not need a bandage unless otherwise instructed. Do not apply any ointments or creams to your incision. If you clothing is irritating, you may cover your incision with a dry gauze pad.  Diet  Resume your normal diet. There are no special food restrictions following this procedure. A low fat/low cholesterol diet is recommended for all patients with vascular disease. In order to heal from your surgery, it is CRITICAL to get adequate nutrition. Your body requires vitamins, minerals, and protein. Vegetables are the best source of vitamins and minerals. Vegetables also provide the perfect balance of protein. Processed food has little nutritional value, so try to avoid this.  Medications  Resume taking all of your medications unless your doctor or nurse practitioner tells you not to. If your incision is causing pain, you may take  over-the-counter pain relievers such as acetaminophen (Tylenol). If you were prescribed a stronger pain medication, please be aware these medications can cause nausea and constipation. Prevent nausea by taking the medication with a snack or meal. Avoid constipation by drinking plenty of fluids and eating foods with a high amount of fiber, such as fruits, vegetables, and grains. Do not take Tylenol if you are taking prescription pain medications.   Follow up  Our office will schedule a follow-up appointment with a C.T. scan 3-4 weeks after your surgery.  Please call us immediately for any of the following conditions  Severe or worsening pain in your legs or feet or in your abdomen back or chest. Increased pain, redness, drainage (pus) from your incision sit. Increased abdominal pain, bloating, nausea, vomiting or persistent diarrhea. Fever of 101 degrees or higher. Swelling in your leg (s),  Reduce your risk of vascular disease  Stop smoking. If you would like help call [facility] at 1-800-QUIT-NOW (1-800-784-8669) or [place] at [phone]. Manage your cholesterol Maintain a desired weight Control your diabetes Keep your blood pressure down  If you have questions, please call the office at [phone].   

## 2018-05-08 NOTE — Progress Notes (Signed)
20 meq Potassium given for K level of 3.4 per MD standing order

## 2018-05-15 NOTE — Discharge Summary (Signed)
EVAR Discharge Summary   Lucas SoursJoseph D Garcia 04/28/1945 73 y.o. male  MRN: [id number]  Admission Date: 05/07/2018  Discharge Date: 05/08/18  Physician: Dr. Edilia Boickson  Admission Diagnosis: AAA (abdominal aortic aneurysm) [facility](HCC) [I71.4]  Discharge Day services:    see progress note 05/08/18 Physical Exam: Vitals:   05/08/18 0700 05/08/18 0832  BP: 128/71 132/74  Pulse: (!) 126 (!) 110  Resp: 19   Temp: 98.1 F (36.7 C)   SpO2: 96%     Hospital Course:  The patient was admitted to the hospital and taken to the operating room on 05/07/2018 and underwent endovascular repair of abdominal aortic aneurysm by Dr. Edilia Boickson.     The pt tolerated the procedure well and was transported to the PACU in good condition.   POD #1 he is feeling ready for discharge home.  Percutaneous groin cath sites were unremarkable.  The remainder of the hospital course consisted of increasing mobilization and increasing intake of solids without difficulty.  Home dose of Xarelto was resumed prior to discharge.  Pain medication was E prescribed to the patient's pharmacy.  He will follow-up in office in about 4 weeks with a CTA abdomen pelvis with Dr. Edilia Boickson.  Discharge instructions were reviewed with the patient and he voices understanding.  He was discharged home in stable condition  CBC    Component Value Date/Time   WBC 8.7 05/08/2018 0456   RBC 4.03 (L) 05/08/2018 0456   HGB 11.5 (L) 05/08/2018 0456   HCT 35.7 (L) 05/08/2018 0456   PLT 166 05/08/2018 0456   MCV 88.6 05/08/2018 0456   MCH 28.5 05/08/2018 0456   MCHC 32.2 05/08/2018 0456   RDW 13.1 05/08/2018 0456    BMET    Component Value Date/Time   NA 140 05/08/2018 0456   K 3.4 (L) 05/08/2018 0456   CL 108 05/08/2018 0456   CO2 24 05/08/2018 0456   GLUCOSE 166 (H) 05/08/2018 0456   BUN 12 05/08/2018 0456   CREATININE 0.97 05/08/2018 0456   CALCIUM 8.2 (L) 05/08/2018 0456   GFRNONAA >60 05/08/2018 0456   GFRAA >60 05/08/2018 0456        Discharge Instructions    Call MD for:  redness, tenderness, or signs of infection (pain, swelling, bleeding, redness, odor or green/yellow discharge around incision site)   Complete by:  As directed    Call MD for:  severe or increased pain, loss or decreased feeling  in affected limb(s)   Complete by:  As directed    Call MD for:  temperature >100.5   Complete by:  As directed    Resume previous diet   Complete by:  As directed       Discharge Diagnosis:  AAA (abdominal aortic aneurysm) (HCC) [I71.4]  Secondary Diagnosis: Patient Active Problem List   Diagnosis Date Noted  . AAA (abdominal aortic aneurysm) (HCC) 05/07/2018   Past Medical History:  Diagnosis Date  . A-fib (HCC)   . COPD (chronic obstructive pulmonary disease) (HCC)   . Coronary artery disease   . Hyperlipidemia   . Hypertension   . Hypothyroidism   . Thyroid disease      Allergies as of 05/08/2018   No Known Allergies     Medication List    TAKE these medications   albuterol 108 (90 Base) MCG/ACT inhaler Commonly known as:  PROVENTIL HFA;VENTOLIN HFA Inhale 2 puffs into the lungs every 6 (six) hours as needed for wheezing or shortness of breath.   atorvastatin  40 MG tablet Commonly known as:  LIPITOR Take 40 mg by mouth daily.   COMBIVENT RESPIMAT IN Inhale 1 puff into the lungs 2 (two) times daily.   ferrous sulfate 325 (65 FE) MG tablet Take 325 mg by mouth 2 (two) times daily with a meal.   furosemide 20 MG tablet Commonly known as:  LASIX Take 20 mg by mouth daily.   levothyroxine 112 MCG tablet Commonly known as:  SYNTHROID, LEVOTHROID Take 112 mcg by mouth daily before breakfast.   metoprolol succinate 100 MG 24 hr tablet Commonly known as:  TOPROL-XL Take 100 mg by mouth daily. Take with or immediately following a meal.   traZODone 50 MG tablet Commonly known as:  DESYREL Take 50 mg by mouth at bedtime.   Vitamin D 50 MCG (2000 UT) tablet Take 2,000 Units by  mouth daily.   XARELTO 20 MG Tabs tablet Generic drug:  rivaroxaban Take 20 mg by mouth daily with supper.       Discharge Instructions:   [facility]  Discharge Instructions Endovascular Aortic Aneurysm Repair  Please refer to the following instructions for your post-procedure care. Your surgeon or Physician Assistant will discuss any changes with you.  Activity  You are encouraged to walk as much as you can. You can slowly return to normal activities but must avoid strenuous activity and heavy lifting until your doctor tells you it's OK. Avoid activities such as vacuuming or swinging a gold club. It is normal to feel tired for several weeks after your surgery. Do not drive until your doctor gives the OK and you are no longer taking prescription pain medications. It is also normal to have difficulty with sleep habits, eating, and bowel movements after surgery. These will go away with time.  Bathing/Showering  You may shower after you go home. If you have an incision, do not soak in a bathtub, hot tub, or swim until the incision heals completely.  Incision Care  Shower every day. Clean your incision with mild soap and water. Pat the area dry with a clean towel. You do not need a bandage unless otherwise instructed. Do not apply any ointments or creams to your incision. If you clothing is irritating, you may cover your incision with a dry gauze pad.  Diet  Resume your normal diet. There are no special food restrictions following this procedure. A low fat/low cholesterol diet is recommended for all patients with vascular disease. In order to heal from your surgery, it is CRITICAL to get adequate nutrition. Your body requires vitamins, minerals, and protein. Vegetables are the best source of vitamins and minerals. Vegetables also provide the perfect balance of protein. Processed food has little nutritional value, so try to avoid  this.  Medications  Resume taking all of your medications unless your doctor or Physician Assistnat tells you not to. If your incision is causing pain, you may take over-the-counter pain relievers such as acetaminophen (Tylenol). If you were prescribed a stronger pain medication, please be aware these medications can cause nausea and constipation. Prevent nausea by taking the medication with a snack or meal. Avoid constipation by drinking plenty of fluids and eating foods with a high amount of fiber, such as fruits, vegetables, and grains. Do not take Tylenol if you are taking prescription pain medications.   Follow up  Our office will schedule a follow-up appointment with a C.T. scan 3-4 weeks after your surgery.  Please call us immediately for any  of the following conditions  Severe or worsening pain in your legs or feet or in your abdomen back or chest. Increased pain, redness, drainage (pus) from your incision sit. Increased abdominal pain, bloating, nausea, vomiting or persistent diarrhea. Fever of 101 degrees or higher. Swelling in your leg (s),  Reduce your risk of vascular disease  .Stop smoking. If you would like help call [facility] at 1-800-QUIT-NOW ([phone]) or [place] at [phone]. .Manage your cholesterol .Maintain a desired weight .Control your diabetes .Keep your blood pressure down  If you have questions, please call the office at [phone].   Disposition: home  Patient's condition: is Good  Follow up: 1. Dr. Edilia Boickson in 4 weeks with CTA protocol   Emilie RutterMatthew Collie Wernick, PA-C [facility] [phone] 05/15/2018  5:07 PM   - For VQI Registry use - Post-op:  Time to Extubation: [x]  In OR, [ ]  < 12 hrs, [ ]  12-24 hrs, [ ]  >=24 hrs Vasopressors Req. Post-op: No MI: No., [ ]  Troponin only, [ ]  EKG or Clinical New Arrhythmia: No CHF: No ICU Stay: 0 day in  Transfusion: No     If yes,  units given  Complications: Resp  failure: No., [ ]  Pneumonia, [ ]  Ventilator Chg in renal function: No., [ ]  Inc. Cr > 0.5, [ ]  Temp. Dialysis,  [ ]  Permanent dialysis Leg ischemia: No., no Surgery needed, [ ]  Yes, Surgery needed,  [ ]  Amputation Bowel ischemia: No., [ ]  Medical Rx, [ ]  Surgical Rx Wound complication: No., [ ]  Superficial separation/infection, [ ]  Return to OR Return to OR: No  Return to OR for bleeding: No Stroke: No., [ ]  Minor, [ ]  Major  Discharge medications: Statin use:  Yes  ASA use:  Yes  Plavix use:  No  Beta blocker use:  Yes  ARB use:  No ACEI use:  No CCB use:  No

## 2018-06-04 ENCOUNTER — Other Ambulatory Visit: Payer: Self-pay

## 2018-06-04 DIAGNOSIS — Z48812 Encounter for surgical aftercare following surgery on the circulatory system: Secondary | ICD-10-CM

## 2018-06-04 DIAGNOSIS — I714 Abdominal aortic aneurysm, without rupture, unspecified: Secondary | ICD-10-CM

## 2018-06-04 DIAGNOSIS — I713 Abdominal aortic aneurysm, ruptured, unspecified: Secondary | ICD-10-CM

## 2018-06-07 ENCOUNTER — Telehealth: Payer: Self-pay | Admitting: Vascular Surgery

## 2018-06-07 NOTE — Telephone Encounter (Signed)
-----   Message from Carnella Guadalajara sent at 06/06/2018  9:27 AM EST ----- Regarding: FW: Postop EVAR CTA and see Edilia Bo in 1 month   ----- Message ----- From: Sharee Pimple, RN Sent: 05/07/2018  11:03 AM EST To: [facility] Admin Pool Subject: Postop EVAR CTA and see Edilia Bo in 1 month      ----- Message ----- From: Chuck Hint, MD Sent: 05/07/2018  10:41 AM EST To: [facility] Charge Pool Subject: charge                                          PROCEDURE:   1.  Pre-closure of bilateral common femoral arteries 2.  Aortogram and bilateral iliac arteriogram 3.  Percutaneous endovascular aneurysm repair using 3 components  SURGEON: Di Kindle. Edilia Bo, MD, FACS  ASSIST: Aggie Moats, PA  He will need a follow-up CT angiogram in 1 month and an office visit with me at that time.  Thank you. CD

## 2018-06-07 NOTE — Telephone Encounter (Signed)
sch appt spk to pt 06/12/2018 8am CTA  06/13/2018 130pm p/o MD

## 2018-06-12 ENCOUNTER — Encounter (HOSPITAL_COMMUNITY): Payer: Self-pay

## 2018-06-12 ENCOUNTER — Ambulatory Visit (HOSPITAL_COMMUNITY)
Admission: RE | Admit: 2018-06-12 | Discharge: 2018-06-12 | Disposition: A | Discharge status: Home / Self Care | Payer: No Typology Code available for payment source | Source: Ambulatory Visit | Attending: Vascular Surgery | Admitting: Vascular Surgery

## 2018-06-12 DIAGNOSIS — I714 Abdominal aortic aneurysm, without rupture, unspecified: Secondary | ICD-10-CM

## 2018-06-12 DIAGNOSIS — Z48812 Encounter for surgical aftercare following surgery on the circulatory system: Secondary | ICD-10-CM | POA: Diagnosis present

## 2018-06-12 DIAGNOSIS — I713 Abdominal aortic aneurysm, ruptured, unspecified: Secondary | ICD-10-CM

## 2018-06-12 MED ORDER — IOHEXOL 350 MG/ML SOLN
100.0000 mL | Freq: Once | INTRAVENOUS | Status: AC | PRN
  Administered 2018-06-12: 100 mL via INTRAVENOUS

## 2018-06-12 MED ORDER — SODIUM CHLORIDE (PF) 0.9 % IJ SOLN
INTRAMUSCULAR | Status: AC
  Filled 2018-06-12: qty 50

## 2018-06-13 ENCOUNTER — Encounter: Payer: Self-pay | Admitting: Vascular Surgery

## 2018-06-13 ENCOUNTER — Other Ambulatory Visit: Payer: Self-pay

## 2018-06-13 ENCOUNTER — Ambulatory Visit (INDEPENDENT_AMBULATORY_CARE_PROVIDER_SITE_OTHER): Payer: Self-pay | Admitting: Vascular Surgery

## 2018-06-13 VITALS — BP 137/85 | HR 91 | Temp 98.3°F | Resp 18 | Ht 66.0 in | Wt 174.7 lb

## 2018-06-13 DIAGNOSIS — Z48812 Encounter for surgical aftercare following surgery on the circulatory system: Secondary | ICD-10-CM

## 2018-06-13 DIAGNOSIS — I714 Abdominal aortic aneurysm, without rupture, unspecified: Secondary | ICD-10-CM

## 2018-06-13 NOTE — Progress Notes (Signed)
Patient name: Lucas Garcia MRN: [id number] DOB: Sep 12, 1945 Sex: male  REASON FOR VISIT:   Follow-up after endovascular aneurysm repair  HPI:   Lucas Garcia is a pleasant 73 y.o. male who presented with a 5.6 cm infrarenal abdominal aortic aneurysm.  The patient was high risk for surgery given his pulmonary issues on home O2.  He also has a history of coronary disease.  He was on Xarelto for atrial fibrillation. After preoperative cardiac clearance he underwent an endovascular aneurysm repair using 3 components on 05/07/2018.  He comes in for a one-month follow-up visit.  Since I saw him last, he has no specific complaints.  He has been gradually resuming his normal activities.  He denies abdominal pain or back pain.  Past Medical History:  Diagnosis Date  . A-fib (HCC)   . COPD (chronic obstructive pulmonary disease) (HCC)   . Coronary artery disease   . Hyperlipidemia   . Hypertension   . Hypothyroidism   . Thyroid disease     Family History  Problem Relation Age of Onset  . Alzheimer's disease Mother   . Heart disease Father     SOCIAL HISTORY: Social History   Tobacco Use  . Smoking status: Former Smoker    Packs/day: 0.25    Years: 58.00    Pack years: 14.50    Types: Cigarettes    Last attempt to quit: 04/11/2016    Years since quitting: 2.1  . Smokeless tobacco: Never Used  Substance Use Topics  . Alcohol use: No    No Known Allergies  Current Outpatient Medications  Medication Sig Dispense Refill  . albuterol (PROVENTIL HFA;VENTOLIN HFA) 108 (90 Base) MCG/ACT inhaler Inhale 2 puffs into the lungs every 6 (six) hours as needed for wheezing or shortness of breath.     Marland Kitchen atorvastatin (LIPITOR) 40 MG tablet Take 40 mg by mouth daily.    . Cholecalciferol (VITAMIN D) 50 MCG (2000 UT) tablet Take 2,000 Units by mouth daily.    . ferrous sulfate 325 (65 FE) MG tablet Take 325 mg by mouth 2 (two) times daily with a meal.     . furosemide (LASIX) 20 MG tablet  Take 20 mg by mouth daily.    . Ipratropium-Albuterol (COMBIVENT RESPIMAT IN) Inhale 1 puff into the lungs 2 (two) times daily.     Marland Kitchen levothyroxine (SYNTHROID, LEVOTHROID) 112 MCG tablet Take 112 mcg by mouth daily before breakfast.     . metoprolol succinate (TOPROL-XL) 100 MG 24 hr tablet Take 100 mg by mouth daily. Take with or immediately following a meal.    . oxyCODONE-acetaminophen (PERCOCET/ROXICET) 5-325 MG tablet Take 1 tablet by mouth every 6 (six) hours as needed. 15 tablet 0  . rivaroxaban (XARELTO) 20 MG TABS tablet Take 20 mg by mouth daily with supper.    . traZODone (DESYREL) 50 MG tablet Take 50 mg by mouth at bedtime.     No current facility-administered medications for this visit.     REVIEW OF SYSTEMS:  [X]  denotes positive finding, [ ]  denotes negative finding Cardiac  Comments:  Chest pain or chest pressure:    Shortness of breath upon exertion: x   Short of breath when lying flat: x   Irregular heart rhythm:        Vascular    Pain in calf, thigh, or hip brought on by ambulation:    Pain in feet at night that wakes you up from your sleep:  Blood clot in your veins:    Leg swelling:         Pulmonary    Oxygen at home:    Productive cough:     Wheezing:         Neurologic    Sudden weakness in arms or legs:     Sudden numbness in arms or legs:     Sudden onset of difficulty speaking or slurred speech:    Temporary loss of vision in one eye:     Problems with dizziness:         Gastrointestinal    Blood in stool:     Vomited blood:         Genitourinary    Burning when urinating:     Blood in urine:        Psychiatric    Major depression:         Hematologic    Bleeding problems:    Problems with blood clotting too easily:        Skin    Rashes or ulcers:        Constitutional    Fever or chills:     PHYSICAL EXAM:   Vitals:   06/13/18 1338  Resp: 18  Weight: 174 lb 11.4 oz (79.2 kg)  Height: 5\' 6"  (1.676 m)    GENERAL: The  patient is a well-nourished male, in no acute distress. The vital signs are documented above. CARDIAC: There is a regular rate and rhythm.  VASCULAR:  He has palpable femoral pulses. PULMONARY: There is good air eBoth feet are warm and well-perfused.xchange bilaterally without wheezing or rales. ABDOMEN: Soft and non-tender with normal pitched bowel sounds.  MUSCULOSKELETAL: There are no major deformities or cyanosis. NEUROLOGIC: No focal weakness or paresthesias are detected. SKIN: There are no ulcers or rashes noted. PSYCHIATRIC: The patient has a normal affect.  DATA:    CT ANGIOGRAM ABDOMEN PELVIS: I have reviewed the CT angiogram of his abdomen and pelvis that was done today.  This shows that the endograft is in good position with no evidence of endoleak.  By my measurement the maximum diameter of the aneurysm is 5.4 cm.  MEDICAL ISSUES:   STATUS POST ENDOVASCULAR REPAIR OF 5.6 CM INFRARENAL ABDOMINAL AORTIC ANEURYSM: The patient is doing well status post endovascular repair of his 5.6 cm infrarenal abdominal aortic aneurysm.  There is no evidence of endoleak on his CT scan.  I have ordered a follow-up duplex scan in 6 months and I will see him back at that time.  He knows to call sooner if he has problems.  Waverly Ferrari [facility] [phone]

## 2019-01-17 ENCOUNTER — Other Ambulatory Visit: Payer: Self-pay

## 2019-01-17 DIAGNOSIS — Z20822 Contact with and (suspected) exposure to covid-19: Secondary | ICD-10-CM

## 2019-01-18 LAB — NOVEL CORONAVIRUS, NAA: SARS-CoV-2, NAA: NOT DETECTED

## 2019-03-18 ENCOUNTER — Other Ambulatory Visit: Payer: Self-pay

## 2019-03-18 DIAGNOSIS — Z20822 Contact with and (suspected) exposure to covid-19: Secondary | ICD-10-CM

## 2019-03-20 LAB — NOVEL CORONAVIRUS, NAA: SARS-CoV-2, NAA: NOT DETECTED

## 2020-07-24 ENCOUNTER — Emergency Department (HOSPITAL_COMMUNITY): Payer: No Typology Code available for payment source

## 2020-07-24 ENCOUNTER — Inpatient Hospital Stay (HOSPITAL_COMMUNITY): Payer: No Typology Code available for payment source

## 2020-07-24 ENCOUNTER — Other Ambulatory Visit: Payer: Self-pay

## 2020-07-24 ENCOUNTER — Inpatient Hospital Stay (HOSPITAL_COMMUNITY)
Admission: EM | Admit: 2020-07-24 | Discharge: 2020-08-09 | DRG: 871 | Disposition: E | Payer: No Typology Code available for payment source | Attending: Internal Medicine | Admitting: Internal Medicine

## 2020-07-24 DIAGNOSIS — I4891 Unspecified atrial fibrillation: Principal | ICD-10-CM | POA: Diagnosis present

## 2020-07-24 DIAGNOSIS — I2510 Atherosclerotic heart disease of native coronary artery without angina pectoris: Secondary | ICD-10-CM | POA: Diagnosis present

## 2020-07-24 DIAGNOSIS — R579 Shock, unspecified: Secondary | ICD-10-CM | POA: Insufficient documentation

## 2020-07-24 DIAGNOSIS — Z515 Encounter for palliative care: Secondary | ICD-10-CM

## 2020-07-24 DIAGNOSIS — N289 Disorder of kidney and ureter, unspecified: Secondary | ICD-10-CM | POA: Insufficient documentation

## 2020-07-24 DIAGNOSIS — D649 Anemia, unspecified: Secondary | ICD-10-CM

## 2020-07-24 DIAGNOSIS — R7881 Bacteremia: Secondary | ICD-10-CM | POA: Diagnosis present

## 2020-07-24 DIAGNOSIS — I714 Abdominal aortic aneurysm, without rupture, unspecified: Secondary | ICD-10-CM | POA: Diagnosis present

## 2020-07-24 DIAGNOSIS — A419 Sepsis, unspecified organism: Secondary | ICD-10-CM | POA: Diagnosis present

## 2020-07-24 DIAGNOSIS — S161XXA Strain of muscle, fascia and tendon at neck level, initial encounter: Secondary | ICD-10-CM

## 2020-07-24 DIAGNOSIS — Z951 Presence of aortocoronary bypass graft: Secondary | ICD-10-CM

## 2020-07-24 DIAGNOSIS — G9341 Metabolic encephalopathy: Secondary | ICD-10-CM | POA: Diagnosis present

## 2020-07-24 DIAGNOSIS — S060X9A Concussion with loss of consciousness of unspecified duration, initial encounter: Secondary | ICD-10-CM

## 2020-07-24 DIAGNOSIS — D62 Acute posthemorrhagic anemia: Secondary | ICD-10-CM | POA: Diagnosis present

## 2020-07-24 DIAGNOSIS — E872 Acidosis, unspecified: Secondary | ICD-10-CM | POA: Insufficient documentation

## 2020-07-24 DIAGNOSIS — J439 Emphysema, unspecified: Secondary | ICD-10-CM | POA: Diagnosis present

## 2020-07-24 DIAGNOSIS — R319 Hematuria, unspecified: Secondary | ICD-10-CM | POA: Diagnosis present

## 2020-07-24 DIAGNOSIS — N179 Acute kidney failure, unspecified: Secondary | ICD-10-CM | POA: Diagnosis present

## 2020-07-24 DIAGNOSIS — E039 Hypothyroidism, unspecified: Secondary | ICD-10-CM | POA: Diagnosis present

## 2020-07-24 DIAGNOSIS — Z7951 Long term (current) use of inhaled steroids: Secondary | ICD-10-CM

## 2020-07-24 DIAGNOSIS — N390 Urinary tract infection, site not specified: Secondary | ICD-10-CM | POA: Diagnosis present

## 2020-07-24 DIAGNOSIS — Y92009 Unspecified place in unspecified non-institutional (private) residence as the place of occurrence of the external cause: Secondary | ICD-10-CM | POA: Diagnosis not present

## 2020-07-24 DIAGNOSIS — Z66 Do not resuscitate: Secondary | ICD-10-CM | POA: Diagnosis not present

## 2020-07-24 DIAGNOSIS — Z7189 Other specified counseling: Secondary | ICD-10-CM

## 2020-07-24 DIAGNOSIS — D689 Coagulation defect, unspecified: Secondary | ICD-10-CM | POA: Diagnosis present

## 2020-07-24 DIAGNOSIS — D6489 Other specified anemias: Secondary | ICD-10-CM

## 2020-07-24 DIAGNOSIS — R52 Pain, unspecified: Secondary | ICD-10-CM | POA: Diagnosis not present

## 2020-07-24 DIAGNOSIS — R6521 Severe sepsis with septic shock: Secondary | ICD-10-CM | POA: Diagnosis present

## 2020-07-24 DIAGNOSIS — Z20822 Contact with and (suspected) exposure to covid-19: Secondary | ICD-10-CM | POA: Diagnosis present

## 2020-07-24 DIAGNOSIS — Z9119 Patient's noncompliance with other medical treatment and regimen: Secondary | ICD-10-CM

## 2020-07-24 DIAGNOSIS — I480 Paroxysmal atrial fibrillation: Secondary | ICD-10-CM | POA: Diagnosis present

## 2020-07-24 DIAGNOSIS — I4811 Longstanding persistent atrial fibrillation: Secondary | ICD-10-CM | POA: Diagnosis not present

## 2020-07-24 DIAGNOSIS — Z789 Other specified health status: Secondary | ICD-10-CM | POA: Diagnosis not present

## 2020-07-24 DIAGNOSIS — W19XXXA Unspecified fall, initial encounter: Secondary | ICD-10-CM | POA: Diagnosis present

## 2020-07-24 DIAGNOSIS — S3739XA Other injury of urethra, initial encounter: Secondary | ICD-10-CM | POA: Diagnosis present

## 2020-07-24 DIAGNOSIS — R739 Hyperglycemia, unspecified: Secondary | ICD-10-CM | POA: Diagnosis present

## 2020-07-24 DIAGNOSIS — Z7901 Long term (current) use of anticoagulants: Secondary | ICD-10-CM

## 2020-07-24 DIAGNOSIS — R310 Gross hematuria: Secondary | ICD-10-CM | POA: Diagnosis present

## 2020-07-24 DIAGNOSIS — Z79890 Hormone replacement therapy (postmenopausal): Secondary | ICD-10-CM | POA: Diagnosis not present

## 2020-07-24 DIAGNOSIS — A4151 Sepsis due to Escherichia coli [E. coli]: Principal | ICD-10-CM | POA: Diagnosis present

## 2020-07-24 DIAGNOSIS — Z79899 Other long term (current) drug therapy: Secondary | ICD-10-CM

## 2020-07-24 DIAGNOSIS — R6520 Severe sepsis without septic shock: Secondary | ICD-10-CM | POA: Diagnosis not present

## 2020-07-24 LAB — BLOOD CULTURE ID PANEL (REFLEXED) - BCID2

## 2020-07-24 LAB — I-STAT ARTERIAL BLOOD GAS, ED
Acid-Base Excess: 4 mmol/L — ABNORMAL HIGH (ref 0.0–2.0)
Bicarbonate: 27.8 mmol/L (ref 20.0–28.0)
Calcium, Ion: 1.18 mmol/L (ref 1.15–1.40)
HCT: 20 % — ABNORMAL LOW (ref 39.0–52.0)
Hemoglobin: 6.8 g/dL — CL (ref 13.0–17.0)
O2 Saturation: 91 %
Patient temperature: 98.7
Potassium: 4.1 mmol/L (ref 3.5–5.1)
Sodium: 132 mmol/L — ABNORMAL LOW (ref 135–145)
TCO2: 29 mmol/L (ref 22–32)
pCO2 arterial: 39.3 mmHg (ref 32.0–48.0)
pH, Arterial: 7.458 — ABNORMAL HIGH (ref 7.350–7.450)
pO2, Arterial: 58 mmHg — ABNORMAL LOW (ref 83.0–108.0)

## 2020-07-24 LAB — CBG MONITORING, ED: Glucose-Capillary: 170 mg/dL — ABNORMAL HIGH (ref 70–99)

## 2020-07-24 LAB — BASIC METABOLIC PANEL
Anion gap: 15 (ref 5–15)
Anion gap: 10 (ref 5–15)
BUN: 39 mg/dL — ABNORMAL HIGH (ref 8–23)
BUN: 33 mg/dL — ABNORMAL HIGH (ref 8–23)
CO2: 26 mmol/L (ref 22–32)
CO2: 27 mmol/L (ref 22–32)
Calcium: 8.4 mg/dL — ABNORMAL LOW (ref 8.9–10.3)
Calcium: 8.1 mg/dL — ABNORMAL LOW (ref 8.9–10.3)
Chloride: 91 mmol/L — ABNORMAL LOW (ref 98–111)
Chloride: 96 mmol/L — ABNORMAL LOW (ref 98–111)
Creatinine, Ser: 1.75 mg/dL — ABNORMAL HIGH (ref 0.61–1.24)
Creatinine, Ser: 1.24 mg/dL (ref 0.61–1.24)
GFR, Estimated: 40 mL/min — ABNORMAL LOW (ref >60)
GFR, Estimated: >60 mL/min (ref >60)
Glucose, Bld: 187 mg/dL — ABNORMAL HIGH (ref 70–99)
Glucose, Bld: 169 mg/dL — ABNORMAL HIGH (ref 70–99)
Potassium: 4.4 mmol/L (ref 3.5–5.1)
Potassium: 4.1 mmol/L (ref 3.5–5.1)
Sodium: 132 mmol/L — ABNORMAL LOW (ref 135–145)
Sodium: 133 mmol/L — ABNORMAL LOW (ref 135–145)

## 2020-07-24 LAB — CBC WITH DIFFERENTIAL/PLATELET
Abs Immature Granulocytes: 0 10*3/uL (ref 0.00–0.07)
Basophils Absolute: 0 10*3/uL (ref 0.0–0.1)
Basophils Relative: 0 %
Eosinophils Absolute: 0 10*3/uL (ref 0.0–0.5)
Eosinophils Relative: 0 %
HCT: 24.5 % — ABNORMAL LOW (ref 39.0–52.0)
Hemoglobin: 6.9 g/dL — CL (ref 13.0–17.0)
Lymphocytes Relative: 4 %
Lymphs Abs: 0.2 10*3/uL — ABNORMAL LOW (ref 0.7–4.0)
MCH: 22.8 pg — ABNORMAL LOW (ref 26.0–34.0)
MCHC: 28.2 g/dL — ABNORMAL LOW (ref 30.0–36.0)
MCV: 80.9 fL (ref 80.0–100.0)
Monocytes Absolute: 0.1 10*3/uL (ref 0.1–1.0)
Monocytes Relative: 1 %
Neutro Abs: 5.5 10*3/uL (ref 1.7–7.7)
Neutrophils Relative %: 95 %
Platelets: 288 10*3/uL (ref 150–400)
RBC: 3.03 MIL/uL — ABNORMAL LOW (ref 4.22–5.81)
RDW: 19.5 % — ABNORMAL HIGH (ref 11.5–15.5)
WBC: 5.8 10*3/uL (ref 4.0–10.5)
nRBC: 1.4 % — ABNORMAL HIGH (ref 0.0–0.2)
nRBC: 3 /100 WBC — ABNORMAL HIGH

## 2020-07-24 LAB — FRACTIONAL EXCRET-NA(24HR UR +SERUM)

## 2020-07-24 LAB — CBC
HCT: 26.1 % — ABNORMAL LOW (ref 39.0–52.0)
Hemoglobin: 7.5 g/dL — ABNORMAL LOW (ref 13.0–17.0)
MCH: 22.9 pg — ABNORMAL LOW (ref 26.0–34.0)
MCHC: 28.7 g/dL — ABNORMAL LOW (ref 30.0–36.0)
MCV: 79.6 fL — ABNORMAL LOW (ref 80.0–100.0)
Platelets: 321 10*3/uL (ref 150–400)
RBC: 3.28 MIL/uL — ABNORMAL LOW (ref 4.22–5.81)
RDW: 19.4 % — ABNORMAL HIGH (ref 11.5–15.5)
WBC: 14.8 10*3/uL — ABNORMAL HIGH (ref 4.0–10.5)
nRBC: 0.3 % — ABNORMAL HIGH (ref 0.0–0.2)

## 2020-07-24 LAB — ECHOCARDIOGRAM COMPLETE
AR max vel: 3.14 cm2
AV Area VTI: 2.92 cm2
AV Area mean vel: 2.84 cm2
AV Mean grad: 2.3 mmHg
AV Peak grad: 3.6 mmHg
Ao pk vel: 0.95 m/s
Height: 65 in
S' Lateral: 3 cm
Weight: 2560 oz

## 2020-07-24 LAB — COMPREHENSIVE METABOLIC PANEL
ALT: 15 U/L (ref 0–44)
AST: 22 U/L (ref 15–41)
Albumin: 2 g/dL — ABNORMAL LOW (ref 3.5–5.0)
Alkaline Phosphatase: 97 U/L (ref 38–126)
Anion gap: 17 — ABNORMAL HIGH (ref 5–15)
BUN: 38 mg/dL — ABNORMAL HIGH (ref 8–23)
CO2: 21 mmol/L — ABNORMAL LOW (ref 22–32)
Calcium: 8.3 mg/dL — ABNORMAL LOW (ref 8.9–10.3)
Chloride: 92 mmol/L — ABNORMAL LOW (ref 98–111)
Creatinine, Ser: 1.72 mg/dL — ABNORMAL HIGH (ref 0.61–1.24)
GFR, Estimated: 41 mL/min — ABNORMAL LOW (ref >60)
Glucose, Bld: 290 mg/dL — ABNORMAL HIGH (ref 70–99)
Potassium: 4.4 mmol/L (ref 3.5–5.1)
Sodium: 130 mmol/L — ABNORMAL LOW (ref 135–145)
Total Bilirubin: 1.9 mg/dL — ABNORMAL HIGH (ref 0.3–1.2)
Total Protein: 4.6 g/dL — ABNORMAL LOW (ref 6.5–8.1)

## 2020-07-24 LAB — RESP PANEL BY RT-PCR (FLU A&B, COVID) ARPGX2
Influenza A by PCR: NEGATIVE
Influenza B by PCR: NEGATIVE
SARS Coronavirus 2 by RT PCR: NEGATIVE

## 2020-07-24 LAB — IRON AND TIBC
Iron: 35 ug/dL — ABNORMAL LOW (ref 45–182)
Saturation Ratios: 14 % — ABNORMAL LOW (ref 17.9–39.5)
TIBC: 251 ug/dL (ref 250–450)
UIBC: 216 ug/dL

## 2020-07-24 LAB — RETICULOCYTES
Immature Retic Fract: 30 % — ABNORMAL HIGH (ref 2.3–15.9)
RBC.: 3.25 MIL/uL — ABNORMAL LOW (ref 4.22–5.81)
Retic Count, Absolute: 109.2 10*3/uL (ref 19.0–186.0)
Retic Ct Pct: 3.4 % — ABNORMAL HIGH (ref 0.4–3.1)

## 2020-07-24 LAB — TSH: TSH: 3.013 u[IU]/mL (ref 0.350–4.500)

## 2020-07-24 LAB — PREPARE RBC (CROSSMATCH)

## 2020-07-24 LAB — BRAIN NATRIURETIC PEPTIDE: B Natriuretic Peptide: 231.7 pg/mL — ABNORMAL HIGH (ref 0.0–100.0)

## 2020-07-24 LAB — HEMOGLOBIN AND HEMATOCRIT, BLOOD
HCT: 28 % — ABNORMAL LOW (ref 39.0–52.0)
Hemoglobin: 8.7 g/dL — ABNORMAL LOW (ref 13.0–17.0)

## 2020-07-24 LAB — LACTIC ACID, PLASMA
Lactic Acid, Venous: >11 mmol/L (ref 0.5–1.9)
Lactic Acid, Venous: >11 mmol/L (ref 0.5–1.9)
Lactic Acid, Venous: 1.9 mmol/L (ref 0.5–1.9)

## 2020-07-24 LAB — APTT: aPTT: 46 seconds — ABNORMAL HIGH (ref 24–36)

## 2020-07-24 LAB — ETHANOL: Alcohol, Ethyl (B): <10 mg/dL (ref <10)

## 2020-07-24 LAB — BETA-HYDROXYBUTYRIC ACID: Beta-Hydroxybutyric Acid: 0.07 mmol/L (ref 0.05–0.27)

## 2020-07-24 LAB — HEMOGLOBIN A1C
Hgb A1c MFr Bld: 7 % — ABNORMAL HIGH (ref 4.8–5.6)
Mean Plasma Glucose: 154.2 mg/dL

## 2020-07-24 LAB — GLUCOSE, CAPILLARY
Glucose-Capillary: 189 mg/dL — ABNORMAL HIGH (ref 70–99)
Glucose-Capillary: 208 mg/dL — ABNORMAL HIGH (ref 70–99)
Glucose-Capillary: 161 mg/dL — ABNORMAL HIGH (ref 70–99)
Glucose-Capillary: 106 mg/dL — ABNORMAL HIGH (ref 70–99)

## 2020-07-24 LAB — SALICYLATE LEVEL: Salicylate Lvl: <7 mg/dL — ABNORMAL LOW (ref 7.0–30.0)

## 2020-07-24 LAB — MAGNESIUM
Magnesium: 1.6 mg/dL — ABNORMAL LOW (ref 1.7–2.4)
Magnesium: 1.6 mg/dL — ABNORMAL LOW (ref 1.7–2.4)

## 2020-07-24 LAB — TROPONIN I (HIGH SENSITIVITY)
Troponin I (High Sensitivity): 39 ng/L — ABNORMAL HIGH (ref <18)
Troponin I (High Sensitivity): 35 ng/L — ABNORMAL HIGH (ref <18)

## 2020-07-24 LAB — ABO/RH: ABO/RH(D): A POS

## 2020-07-24 LAB — PHOSPHORUS: Phosphorus: 4.6 mg/dL (ref 2.5–4.6)

## 2020-07-24 LAB — LACTATE DEHYDROGENASE: LDH: 142 U/L (ref 98–192)

## 2020-07-24 LAB — PROTIME-INR
INR: 6.1 (ref 0.8–1.2)
Prothrombin Time: 54.3 seconds — ABNORMAL HIGH (ref 11.4–15.2)

## 2020-07-24 LAB — OSMOLALITY: Osmolality: 293 mOsm/kg (ref 275–295)

## 2020-07-24 LAB — FERRITIN: Ferritin: 166 ng/mL (ref 24–336)

## 2020-07-24 LAB — PATHOLOGIST SMEAR REVIEW

## 2020-07-24 MED ORDER — ORAL CARE MOUTH RINSE
15.0000 mL | Freq: Two times a day (BID) | OROMUCOSAL | Status: DC
  Administered 2020-07-24 – 2020-07-25 (×3): 15 mL via OROMUCOSAL

## 2020-07-24 MED ORDER — LACTATED RINGERS IV BOLUS (SEPSIS)
1000.0000 mL | Freq: Once | INTRAVENOUS | Status: AC
  Administered 2020-07-24: 1000 mL via INTRAVENOUS

## 2020-07-24 MED ORDER — POLYETHYLENE GLYCOL 3350 17 G PO PACK: 17.0000 g | PACK | Freq: Every day | ORAL | Status: DC | PRN

## 2020-07-24 MED ORDER — AMIODARONE LOAD VIA INFUSION
150.0000 mg | Freq: Once | INTRAVENOUS | Status: AC
Start: 2020-07-24 — End: 2020-07-24
  Administered 2020-07-24: 150 mg via INTRAVENOUS
  Filled 2020-07-24: qty 83.34

## 2020-07-24 MED ORDER — SODIUM CHLORIDE 0.9% IV SOLUTION: Freq: Once | INTRAVENOUS | Status: DC

## 2020-07-24 MED ORDER — AMIODARONE HCL IN DEXTROSE 360-4.14 MG/200ML-% IV SOLN
30.0000 mg/h | INTRAVENOUS | Status: DC
  Administered 2020-07-25 (×2): 30 mg/h via INTRAVENOUS
  Filled 2020-07-24: qty 200

## 2020-07-24 MED ORDER — CHLORHEXIDINE GLUCONATE 0.12 % MT SOLN
15.0000 mL | Freq: Two times a day (BID) | OROMUCOSAL | Status: DC
  Administered 2020-07-24 – 2020-07-25 (×2): 15 mL via OROMUCOSAL

## 2020-07-24 MED ORDER — SODIUM CHLORIDE 0.9 % IV SOLN
250.0000 mL | INTRAVENOUS | Status: DC
  Administered 2020-07-24: 250 mL via INTRAVENOUS

## 2020-07-24 MED ORDER — ACETAMINOPHEN 650 MG RE SUPP
650.0000 mg | Freq: Once | RECTAL | Status: AC
  Administered 2020-07-24: 650 mg via RECTAL
  Filled 2020-07-24: qty 1

## 2020-07-24 MED ORDER — METRONIDAZOLE IN NACL 5-0.79 MG/ML-% IV SOLN
500.0000 mg | Freq: Once | INTRAVENOUS | Status: AC
  Administered 2020-07-24: 500 mg via INTRAVENOUS
  Filled 2020-07-24: qty 100

## 2020-07-24 MED ORDER — IOHEXOL 350 MG/ML SOLN
75.0000 mL | Freq: Once | INTRAVENOUS | Status: AC | PRN
  Administered 2020-07-24: 75 mL via INTRAVENOUS

## 2020-07-24 MED ORDER — AMIODARONE HCL IN DEXTROSE 360-4.14 MG/200ML-% IV SOLN
60.0000 mg/h | INTRAVENOUS | Status: AC
  Administered 2020-07-24 – 2020-07-25 (×2): 60 mg/h via INTRAVENOUS
  Filled 2020-07-24 (×2): qty 200

## 2020-07-24 MED ORDER — VITAMIN K1 10 MG/ML IJ SOLN
1.0000 mg | Freq: Once | INTRAVENOUS | Status: AC
  Administered 2020-07-24: 1 mg via INTRAVENOUS
  Filled 2020-07-24: qty 0.1

## 2020-07-24 MED ORDER — LACTATED RINGERS IV SOLN: INTRAVENOUS | Status: AC

## 2020-07-24 MED ORDER — SODIUM CHLORIDE 0.9 % IV SOLN
2.0000 g | Freq: Once | INTRAVENOUS | Status: AC
  Administered 2020-07-24: 2 g via INTRAVENOUS
  Filled 2020-07-24: qty 2

## 2020-07-24 MED ORDER — VITAMIN K1 10 MG/ML IJ SOLN
10.0000 mg | Freq: Once | INTRAVENOUS | Status: AC
  Administered 2020-07-24: 10 mg via INTRAVENOUS
  Filled 2020-07-24: qty 1

## 2020-07-24 MED ORDER — INSULIN ASPART 100 UNIT/ML ~~LOC~~ SOLN
0.0000 [IU] | SUBCUTANEOUS | Status: DC
  Administered 2020-07-24: 3 [IU] via SUBCUTANEOUS
  Administered 2020-07-24 (×3): 2 [IU] via SUBCUTANEOUS
  Administered 2020-07-25: 1 [IU] via SUBCUTANEOUS

## 2020-07-24 MED ORDER — LACTATED RINGERS IV BOLUS (SEPSIS)
250.0000 mL | Freq: Once | INTRAVENOUS | Status: AC
  Administered 2020-07-24: 250 mL via INTRAVENOUS

## 2020-07-24 MED ORDER — CHLORHEXIDINE GLUCONATE CLOTH 2 % EX PADS
6.0000 | MEDICATED_PAD | Freq: Every day | CUTANEOUS | Status: DC
  Administered 2020-07-24: 6 via TOPICAL

## 2020-07-24 MED ORDER — SODIUM CHLORIDE 0.9 % IV SOLN
2.0000 g | Freq: Two times a day (BID) | INTRAVENOUS | Status: DC
  Administered 2020-07-24: 2 g via INTRAVENOUS
  Filled 2020-07-24: qty 2

## 2020-07-24 MED ORDER — DOCUSATE SODIUM 100 MG PO CAPS: 100.0000 mg | ORAL_CAPSULE | Freq: Two times a day (BID) | ORAL | Status: DC | PRN

## 2020-07-24 MED ORDER — INSULIN ASPART 100 UNIT/ML ~~LOC~~ SOLN: 5.0000 [IU] | Freq: Once | SUBCUTANEOUS | Status: DC

## 2020-07-24 MED ORDER — NOREPINEPHRINE 4 MG/250ML-% IV SOLN
2.0000 ug/min | INTRAVENOUS | Status: DC
Start: 2020-07-24 — End: 2020-07-25
  Administered 2020-07-24: 2 ug/min via INTRAVENOUS
  Filled 2020-07-24: qty 250

## 2020-07-24 MED ORDER — SODIUM CHLORIDE 0.9 % IV SOLN
2.0000 g | INTRAVENOUS | Status: DC
  Administered 2020-07-25: 2 g via INTRAVENOUS
  Filled 2020-07-24: qty 20

## 2020-07-24 MED ORDER — LACTATED RINGERS IV BOLUS
1000.0000 mL | Freq: Once | INTRAVENOUS | Status: AC
  Administered 2020-07-24: 1000 mL via INTRAVENOUS

## 2020-07-24 MED ORDER — VANCOMYCIN HCL 1250 MG/250ML IV SOLN: 1250.0000 mg | INTRAVENOUS | Status: DC

## 2020-07-24 MED ORDER — VANCOMYCIN HCL 1000 MG/200ML IV SOLN
1000.0000 mg | Freq: Once | INTRAVENOUS | Status: AC
  Administered 2020-07-24: 1000 mg via INTRAVENOUS
  Filled 2020-07-24: qty 200

## 2020-07-24 MED ORDER — PERFLUTREN LIPID MICROSPHERE
1.0000 mL | INTRAVENOUS | Status: AC | PRN
  Administered 2020-07-24: 3 mL via INTRAVENOUS
  Filled 2020-07-24: qty 10

## 2020-07-24 NOTE — Progress Notes (Signed)
Code Sepsis done. Third lactate ordered but still pending.

## 2020-07-24 NOTE — ED Notes (Signed)
Pt transported to CT ?

## 2020-07-24 NOTE — Progress Notes (Signed)
PHARMACY - PHYSICIAN COMMUNICATION CRITICAL VALUE ALERT - BLOOD CULTURE IDENTIFICATION (BCID)  Lucas Garcia is an 75 y.o. male who presented to [facility] on 07/29/2020 with a chief complaint of fall.   Assessment:  3/4 GNR identified as e. Coli no resistance found  Name of physician (or Provider) Contacted: Dr. Merrily Pew  Current antibiotics: Cefepime 2g IV q12h and vancomycin 1250mg  q36h  Changes to prescribed antibiotics recommended: Consider de-escalation to ceftriaxone 2g Q24h - will discuss with CCM.    No results found for this or any previous visit.  , PharmD Clinical Pharmacist  07/25/2020  5:15 PM

## 2020-07-24 NOTE — Progress Notes (Signed)
eLink Physician-Brief Progress Note Patient Name: [name] DOB: [birth year] MRN: [id number]   Date of Service  08-10-20  HPI/Events of Note  Bedside RN asking for a PRN iv narcotic for back pain but patient is fast asleep at this time.  eICU Interventions  No indication for iv pain medications at this time, on awakening RN requested to do a bedside swallow evaluation and if he passes it he will be placed on PRN oral pain medications.        Lucas Garcia Aug 10, 2020, 11:14 PM

## 2020-07-24 NOTE — Consult Note (Signed)
Consultation: Gross hematuria, urethral bleeding Requested by: Dr. Cheri Fowler  History of Present Illness: Lucas Garcia is a 75 year old male admitted to [place] earlier this week.  He left AMA.  Evidently had a Foley catheter and forcibly remove that on his own.  He has a history of A. fib on Xarelto.  He was found to have anemia here on admission and bleeding per his urethra.  CT scan obtained earlier today (CT angio) showed benign kidneys, bladder and prostate.  No clot in bladder.  Ultrasound this afternoon to confirm no clot in bladder and Foley in good position.  His urine has cleared and urethral bleeding has subsided as his coagulopathy has been corrected.  No past medical history on file. History at [facility] medical center   Home Medications:  Medications Prior to Admission  Medication Sig Dispense Refill Last Dose  . ALPRAZolam (XANAX) 0.25 MG tablet Take 0.25 mg by mouth every 12 (twelve) hours as needed for anxiety.     Marland Kitchen atorvastatin (LIPITOR) 40 MG tablet Take 40 mg by mouth at bedtime.     . chlorpheniramine (CHLOR-TRIMETON) 4 MG tablet Take 4 mg by mouth every 8 (eight) hours as needed for allergies.     . Fluticasone-Salmeterol (ADVAIR) 250-50 MCG/DOSE AEPB Inhale 1 puff into the lungs in the morning and at bedtime.     . furosemide (LASIX) 20 MG tablet Take 20 mg by mouth daily.     Marland Kitchen levothyroxine (SYNTHROID) 112 MCG tablet Take 112 mcg by mouth daily before breakfast.     . metoprolol tartrate (LOPRESSOR) 100 MG tablet Take 100 mg by mouth in the morning and at bedtime.     . rivaroxaban (XARELTO) 20 MG TABS tablet Take 20 mg by mouth daily with supper.     . traZODone (DESYREL) 50 MG tablet Take 25-50 mg by mouth See admin instructions. Take 25 mg every AM, and take 50 mg at bedtime for sleep and anxiety.      Allergies: No Known Allergies  No family history on file. Social History:  has no history on file for tobacco use, alcohol use, and drug use.  ROS: A complete review of  systems was performed.  All systems are negative except for pertinent findings as noted. Review of Systems  All other systems reviewed and are negative.    Physical Exam:  Vital signs in last 24 hours: Temp:  [97.2 F (36.2 C)-100.1 F (37.8 C)] 97.2 F (36.2 C) (04/15 1537) Pulse Rate:  [28-167] 142 (04/15 1645) Resp:  [15-32] 18 (04/15 1645) BP: (80-159)/(46-148) 101/71 (04/15 1645) SpO2:  [90 %-100 %] 99 % (04/15 1645) Weight:  [72.6 kg] 72.6 kg (04/15 0156) General:  Alert and oriented, No acute distress, undergoing echocardiogram HEENT: Normocephalic, atraumatic Cardiovascular: Regular rate and rhythm Lungs: Regular rate and effort Abdomen: Soft, nontender, nondistended, no abdominal masses Back: No CVA tenderness Extremities: No edema Neurologic: Grossly intact GU: Foley catheter in place, urine clear in tubing.  Some dried blood around the catheter.  No active urethral bleeding.  Laboratory Data:  Results for orders placed or performed during the hospital encounter of 28-Jul-2020 (from the past 24 hour(s))  Resp Panel by RT-PCR (Flu A&B, Covid) Nasopharyngeal Swab     Status: None   Collection Time: 28-Jul-2020  1:46 AM   Specimen: Nasopharyngeal Swab; Nasopharyngeal(NP) swabs in vial transport medium  Result Value Ref Range   SARS Coronavirus 2 by RT PCR NEGATIVE NEGATIVE   Influenza A by PCR  NEGATIVE NEGATIVE   Influenza B by PCR NEGATIVE NEGATIVE  Comprehensive metabolic panel     Status: Abnormal   Collection Time: 07/29/2020  1:46 AM  Result Value Ref Range   Sodium 130 (L) 135 - 145 mmol/L   Potassium 4.4 3.5 - 5.1 mmol/L   Chloride 92 (L) 98 - 111 mmol/L   CO2 21 (L) 22 - 32 mmol/L   Glucose, Bld 290 (H) 70 - 99 mg/dL   BUN 38 (H) 8 - 23 mg/dL   Creatinine, Ser 7.90 (H) 0.61 - 1.24 mg/dL   Calcium 8.3 (L) 8.9 - 10.3 mg/dL   Total Protein 4.6 (L) 6.5 - 8.1 g/dL   Albumin 2.0 (L) 3.5 - 5.0 g/dL   AST 22 15 - 41 U/L   ALT 15 0 - 44 U/L   Alkaline Phosphatase 97  38 - 126 U/L   Total Bilirubin 1.9 (H) 0.3 - 1.2 mg/dL   GFR, Estimated 41 (L) >60 mL/min   Anion gap 17 (H) 5 - 15  CBC WITH DIFFERENTIAL     Status: Abnormal   Collection Time: 07/22/2020  1:46 AM  Result Value Ref Range   WBC 5.8 4.0 - 10.5 K/uL   RBC 3.03 (L) 4.22 - 5.81 MIL/uL   Hemoglobin 6.9 (LL) 13.0 - 17.0 g/dL   HCT 24.0 (L) 97.3 - 53.2 %   MCV 80.9 80.0 - 100.0 fL   MCH 22.8 (L) 26.0 - 34.0 pg   MCHC 28.2 (L) 30.0 - 36.0 g/dL   RDW 99.2 (H) 42.6 - 83.4 %   Platelets 288 150 - 400 K/uL   nRBC 1.4 (H) 0.0 - 0.2 %   Neutrophils Relative % 95 %   Neutro Abs 5.5 1.7 - 7.7 K/uL   Lymphocytes Relative 4 %   Lymphs Abs 0.2 (L) 0.7 - 4.0 K/uL   Monocytes Relative 1 %   Monocytes Absolute 0.1 0.1 - 1.0 K/uL   Eosinophils Relative 0 %   Eosinophils Absolute 0.0 0.0 - 0.5 K/uL   Basophils Relative 0 %   Basophils Absolute 0.0 0.0 - 0.1 K/uL   nRBC 3 (H) 0 /100 WBC   Abs Immature Granulocytes 0.00 0.00 - 0.07 K/uL   Tear Drop Cells PRESENT    Polychromasia MARKED   Protime-INR     Status: Abnormal   Collection Time: 08/06/2020  1:46 AM  Result Value Ref Range   Prothrombin Time 54.3 (H) 11.4 - 15.2 seconds   INR 6.1 (HH) 0.8 - 1.2  APTT     Status: Abnormal   Collection Time: 07/25/2020  1:46 AM  Result Value Ref Range   aPTT 46 (H) 24 - 36 seconds  ABO/Rh     Status: None   Collection Time: 07/22/2020  1:46 AM  Result Value Ref Range   ABO/RH(D)      A POS Performed at [facility], [address], [place], [place] [address]   Blood Culture (routine x 2)     Status: Abnormal (Preliminary result)   Collection Time: 07/23/2020  2:20 AM   Specimen: BLOOD LEFT HAND  Result Value Ref Range   Specimen Description BLOOD LEFT HAND    Special Requests      BOTTLES DRAWN AEROBIC AND ANAEROBIC Blood Culture adequate volume   Culture  Setup Time (A)     GRAM VARIABLE ROD IN BOTH AEROBIC AND ANAEROBIC BOTTLES CRITICAL VALUE NOTED.  VALUE IS CONSISTENT WITH PREVIOUSLY REPORTED  AND  CALLED VALUE. Performed at [facility], [address], [place], [place] [address]    Culture PENDING    Report Status PENDING   Blood Culture (routine x 2)     Status: None (Preliminary result)   Collection Time: 08-19-20  2:22 AM   Specimen: BLOOD  Result Value Ref Range   Specimen Description BLOOD LEFT ANTECUBITAL    Special Requests      BOTTLES DRAWN AEROBIC AND ANAEROBIC Blood Culture adequate volume   Culture  Setup Time      GRAM NEGATIVE RODS ANAEROBIC BOTTLE ONLY Organism ID to follow Performed at [facility], [address] [place], [place] [address]    Culture GRAM NEGATIVE RODS    Report Status PENDING   Lactic acid, plasma     Status: Abnormal   Collection Time: 19-Aug-2020  2:41 AM  Result Value Ref Range   Lactic Acid, Venous >11.0 (HH) 0.5 - 1.9 mmol/L  Lactic acid, plasma     Status: Abnormal   Collection Time: 08-19-2020  4:20 AM  Result Value Ref Range   Lactic Acid, Venous >11.0 (HH) 0.5 - 1.9 mmol/L  CBC     Status: Abnormal   Collection Time: Aug 19, 2020  4:49 AM  Result Value Ref Range   WBC 14.8 (H) 4.0 - 10.5 K/uL   RBC 3.28 (L) 4.22 - 5.81 MIL/uL   Hemoglobin 7.5 (L) 13.0 - 17.0 g/dL   HCT 09.8 (L) 11.9 - 14.7 %   MCV 79.6 (L) 80.0 - 100.0 fL   MCH 22.9 (L) 26.0 - 34.0 pg   MCHC 28.7 (L) 30.0 - 36.0 g/dL   RDW 82.9 (H) 56.2 - 13.0 %   Platelets 321 150 - 400 K/uL   nRBC 0.3 (H) 0.0 - 0.2 %  Basic metabolic panel     Status: Abnormal   Collection Time: 19-Aug-2020  4:49 AM  Result Value Ref Range   Sodium 132 (L) 135 - 145 mmol/L   Potassium 4.4 3.5 - 5.1 mmol/L   Chloride 91 (L) 98 - 111 mmol/L   CO2 26 22 - 32 mmol/L   Glucose, Bld 187 (H) 70 - 99 mg/dL   BUN 39 (H) 8 - 23 mg/dL   Creatinine, Ser 8.65 (H) 0.61 - 1.24 mg/dL   Calcium 8.4 (L) 8.9 - 10.3 mg/dL   GFR, Estimated 40 (L) >60 mL/min   Anion gap 15 5 - 15  Magnesium     Status: Abnormal   Collection Time: 19-Aug-2020  4:49 AM  Result Value Ref Range   Magnesium 1.6 (L)  1.7 - 2.4 mg/dL  Phosphorus     Status: None   Collection Time: 08-19-20  4:49 AM  Result Value Ref Range   Phosphorus 4.6 2.5 - 4.6 mg/dL  Pathologist smear review     Status: None   Collection Time: 08-19-2020  4:49 AM  Result Value Ref Range   Path Review      Neutrophilia with scattered bands.  Rare plasma cells.  Anemia with anisopoikilocytois, polychromasia, and ovalocytes.  Magnesium     Status: Abnormal   Collection Time: 08/19/2020  5:09 AM  Result Value Ref Range   Magnesium 1.6 (L) 1.7 - 2.4 mg/dL  Troponin I (High Sensitivity)     Status: Abnormal   Collection Time: Aug 19, 2020  5:09 AM  Result Value Ref Range   Troponin I (High Sensitivity) 39 (H) <18 ng/L  Brain natriuretic peptide  Status: Abnormal   Collection Time: 07/27/2020  5:11 AM  Result Value Ref Range   B Natriuretic Peptide 231.7 (H) 0.0 - 100.0 pg/mL  Fractional excretion-na(24hr ur +serum)     Status: None   Collection Time: 07/22/2020  5:17 AM  Result Value Ref Range   Sodium TEST REQUEST RECEIVED WITHOUT APPROPRIATE SPECIMEN 135 - 145 mmol/L   Creatinine, Ser TEST REQUEST RECEIVED WITHOUT APPROPRIATE SPECIMEN 0.61 - 1.24 mg/dL   Sodium, Ur TEST REQUEST RECEIVED WITHOUT APPROPRIATE SPECIMEN mmol/L   Creatinine, Urine TEST REQUEST RECEIVED WITHOUT APPROPRIATE SPECIMEN mg/dL   Fractional Excretion of Na TEST REQUEST RECEIVED WITHOUT APPROPRIATE SPECIMEN %  Beta-hydroxybutyric acid     Status: None   Collection Time: 07/13/2020  5:20 AM  Result Value Ref Range   Beta-Hydroxybutyric Acid 0.07 0.05 - 0.27 mmol/L  Hemoglobin A1c     Status: Abnormal   Collection Time: 08/05/2020  5:20 AM  Result Value Ref Range   Hgb A1c MFr Bld 7.0 (H) 4.8 - 5.6 %   Mean Plasma Glucose 154.2 mg/dL  Ethanol     Status: None   Collection Time: 08/05/2020  5:20 AM  Result Value Ref Range   Alcohol, Ethyl (B) <10 <10 mg/dL  Prepare RBC (crossmatch)     Status: None   Collection Time: 08/02/2020  5:30 AM  Result Value Ref Range   Order  Confirmation      ORDER PROCESSED BY BLOOD BANK Performed at [facility], [address] [place], [place] [address]   Lactate dehydrogenase     Status: None   Collection Time: 07/19/2020  5:33 AM  Result Value Ref Range   LDH 142 98 - 192 U/L  Type and screen     Status: None (Preliminary result)   Collection Time: 07/22/2020  5:44 AM  Result Value Ref Range   ABO/RH(D) A POS    Antibody Screen NEG    Sample Expiration 07/27/2020,2359    Unit Number [id number]    Blood Component Type RED CELLS,LR    Unit division 00    Status of Unit ISSUED    Transfusion Status OK TO TRANSFUSE    Crossmatch Result Compatible    Unit Number [id number]    Blood Component Type RED CELLS,LR    Unit division 00    Status of Unit ISSUED    Transfusion Status OK TO TRANSFUSE    Crossmatch Result      Compatible Performed at [facility], [address], [place], [place] [address]   CBG monitoring, ED     Status: Abnormal   Collection Time: 08/04/2020  5:55 AM  Result Value Ref Range   Glucose-Capillary 170 (H) 70 - 99 mg/dL   Comment 1 Notify RN   I-Stat arterial blood gas, ED     Status: Abnormal   Collection Time: 08/03/2020  6:46 AM  Result Value Ref Range   pH, Arterial 7.458 (H) 7.350 - 7.450   pCO2 arterial 39.3 32.0 - 48.0 mmHg   pO2, Arterial 58 (L) 83.0 - 108.0 mmHg   Bicarbonate 27.8 20.0 - 28.0 mmol/L   TCO2 29 22 - 32 mmol/L   O2 Saturation 91.0 %   Acid-Base Excess 4.0 (H) 0.0 - 2.0 mmol/L   Sodium 132 (L) 135 - 145 mmol/L   Potassium 4.1 3.5 - 5.1 mmol/L   Calcium, Ion 1.18 1.15 - 1.40 mmol/L   HCT 20.0 (L) 39.0 - 52.0 %   Hemoglobin  6.8 (LL) 13.0 - 17.0 g/dL   Patient temperature 40.3 F    Collection site Radial    Drawn by HIDE    Sample type ARTERIAL    Comment NOTIFIED PHYSICIAN   Glucose, capillary     Status: Abnormal   Collection Time: 11-Aug-2020  8:42 AM  Result Value Ref Range   Glucose-Capillary 189 (H) 70 - 99 mg/dL  Glucose, capillary     Status:  Abnormal   Collection Time: 2020-08-11 11:10 AM  Result Value Ref Range   Glucose-Capillary 208 (H) 70 - 99 mg/dL  Prepare RBC (crossmatch)     Status: None   Collection Time: 2020-08-11  1:30 PM  Result Value Ref Range   Order Confirmation      ORDER PROCESSED BY BLOOD BANK Performed at [facility], [address], [place], [place] [address]   Reticulocytes     Status: Abnormal   Collection Time: 08/11/20  2:43 PM  Result Value Ref Range   Retic Ct Pct 3.4 (H) 0.4 - 3.1 %   RBC. 3.25 (L) 4.22 - 5.81 MIL/uL   Retic Count, Absolute 109.2 19.0 - 186.0 K/uL   Immature Retic Fract 30.0 (H) 2.3 - 15.9 %  TSH     Status: None   Collection Time: 11-Aug-2020  2:43 PM  Result Value Ref Range   TSH 3.013 0.350 - 4.500 uIU/mL  Salicylate level     Status: Abnormal   Collection Time: Aug 11, 2020  2:43 PM  Result Value Ref Range   Salicylate Lvl <7.0 (L) 7.0 - 30.0 mg/dL  Osmolality     Status: None   Collection Time: 08-11-20  2:43 PM  Result Value Ref Range   Osmolality 293 275 - 295 mOsm/kg  Basic metabolic panel     Status: Abnormal   Collection Time: 08-11-20  2:43 PM  Result Value Ref Range   Sodium 133 (L) 135 - 145 mmol/L   Potassium 4.1 3.5 - 5.1 mmol/L   Chloride 96 (L) 98 - 111 mmol/L   CO2 27 22 - 32 mmol/L   Glucose, Bld 169 (H) 70 - 99 mg/dL   BUN 33 (H) 8 - 23 mg/dL   Creatinine, Ser 9.56 0.61 - 1.24 mg/dL   Calcium 8.1 (L) 8.9 - 10.3 mg/dL   GFR, Estimated >38 >75 mL/min   Anion gap 10 5 - 15  Lactic acid, plasma     Status: None   Collection Time: 08-11-2020  2:43 PM  Result Value Ref Range   Lactic Acid, Venous 1.9 0.5 - 1.9 mmol/L  Iron and TIBC     Status: Abnormal   Collection Time: 2020-08-11  2:43 PM  Result Value Ref Range   Iron 35 (L) 45 - 182 ug/dL   TIBC 643 329 - 518 ug/dL   Saturation Ratios 14 (L) 17.9 - 39.5 %   UIBC 216 ug/dL  Ferritin     Status: None   Collection Time: 2020/08/11  2:43 PM  Result Value Ref Range   Ferritin 166 24 - 336 ng/mL   Troponin I (High Sensitivity)     Status: Abnormal   Collection Time: 2020/08/11  2:43 PM  Result Value Ref Range   Troponin I (High Sensitivity) 35 (H) <18 ng/L  Glucose, capillary     Status: Abnormal   Collection Time: 11-Aug-2020  3:17 PM  Result Value Ref Range   Glucose-Capillary 161 (H) 70 - 99 mg/dL   Recent Results (from the  past 240 hour(s))  Resp Panel by RT-PCR (Flu A&B, Covid) Nasopharyngeal Swab     Status: None   Collection Time: 07/23/2020  1:46 AM   Specimen: Nasopharyngeal Swab; Nasopharyngeal(NP) swabs in vial transport medium  Result Value Ref Range Status   SARS Coronavirus 2 by RT PCR NEGATIVE NEGATIVE Final    Comment: (NOTE) SARS-CoV-2 target nucleic acids are NOT DETECTED.  The SARS-CoV-2 RNA is generally detectable in upper respiratory specimens during the acute phase of infection. The lowest concentration of SARS-CoV-2 viral copies this assay can detect is 138 copies/mL. A negative result does not preclude SARS-Cov-2 infection and should not be used as the sole basis for treatment or other patient management decisions. A negative result may occur with  improper specimen collection/handling, submission of specimen other than nasopharyngeal swab, presence of viral mutation(s) within the areas targeted by this assay, and inadequate number of viral copies(<138 copies/mL). A negative result must be combined with clinical observations, patient history, and epidemiological information. The expected result is Negative.  Fact Sheet for Patients:  BloggerCourse.comhttps://www.fda.gov/media/152166/download  Fact Sheet for Healthcare Providers:  SeriousBroker.ithttps://www.fda.gov/media/152162/download  This test is no t yet approved or cleared by the [place] FDA and  has been authorized for detection and/or diagnosis of SARS-CoV-2 by FDA under an Emergency Use Authorization (EUA). This EUA will remain  in effect (meaning this test can be used) for the duration of the COVID-19 declaration  under Section 564(b)(1) of the Act, 21 U.S.C.section 360bbb-3(b)(1), unless the authorization is terminated  or revoked sooner.       Influenza A by PCR NEGATIVE NEGATIVE Final   Influenza B by PCR NEGATIVE NEGATIVE Final    Comment: (NOTE) The Xpert Xpress SARS-CoV-2/FLU/RSV plus assay is intended as an aid in the diagnosis of influenza from Nasopharyngeal swab specimens and should not be used as a sole basis for treatment. Nasal washings and aspirates are unacceptable for Xpert Xpress SARS-CoV-2/FLU/RSV testing.  Fact Sheet for Patients: BloggerCourse.comhttps://www.fda.gov/media/152166/download  Fact Sheet for Healthcare Providers: SeriousBroker.ithttps://www.fda.gov/media/152162/download  This test is not yet approved or cleared by the [place] FDA and has been authorized for detection and/or diagnosis of SARS-CoV-2 by FDA under an Emergency Use Authorization (EUA). This EUA will remain in effect (meaning this test can be used) for the duration of the COVID-19 declaration under Section 564(b)(1) of the Act, 21 U.S.C. section 360bbb-3(b)(1), unless the authorization is terminated or revoked.  Performed at [facility], [address] [place], [place] [address]   Blood Culture (routine x 2)     Status: Abnormal (Preliminary result)   Collection Time: 07/23/2020  2:20 AM   Specimen: BLOOD LEFT HAND  Result Value Ref Range Status   Specimen Description BLOOD LEFT HAND  Final   Special Requests   Final    BOTTLES DRAWN AEROBIC AND ANAEROBIC Blood Culture adequate volume   Culture  Setup Time (A)  Final    GRAM VARIABLE ROD IN BOTH AEROBIC AND ANAEROBIC BOTTLES CRITICAL VALUE NOTED.  VALUE IS CONSISTENT WITH PREVIOUSLY REPORTED AND CALLED VALUE. Performed at [facility], [address] [place], [place] [address]    Culture PENDING  Incomplete   Report Status PENDING  Incomplete  Blood Culture (routine x 2)     Status: None (Preliminary result)   Collection Time: 07/10/2020  2:22 AM    Specimen: BLOOD  Result Value Ref Range Status   Specimen Description BLOOD LEFT ANTECUBITAL  Final   Special Requests  Final    BOTTLES DRAWN AEROBIC AND ANAEROBIC Blood Culture adequate volume   Culture  Setup Time   Final    GRAM NEGATIVE RODS ANAEROBIC BOTTLE ONLY Organism ID to follow Performed at [facility], [address] [place], [place] [address]    Culture GRAM NEGATIVE RODS  Final   Report Status PENDING  Incomplete   Creatinine: Recent Labs    Aug 21, 2020 0146 2020/08/21 0449 Aug 21, 2020 0517 2020/08/21 1443  CREATININE 1.72* 1.75* TEST REQUEST RECEIVED WITHOUT APPROPRIATE SPECIMEN 1.24   CT angio and pelvic US images independently reviewed.   Impression/Assessment/plan:  Urethral bleeding, gross hematuria-likely from urethral trauma.  I would recommend leaving the catheter for about 5 days to allow his urethra and bladder to heal.  Catheter can then be removed if he is ambulatory. He might benefit from cystoscopy as an outpatient.  I will sign off but please page GU with any questions or concerns.  Jerilee Field 2020/08/21, 5:07 PM

## 2020-07-24 NOTE — Progress Notes (Signed)
Pharmacy Antibiotic Note  Lucas Garcia is a 75 y.o. male admitted on 07-30-2020 with sepsis.  Pharmacy has been consulted for Vancomycin and Cefepime dosing.  Plan: Cefepime 2gm IV q12h Vancomycin 1250 mg IV Q 36 hrs. Goal AUC 400-550. Expected AUC: 504 SCr used: 1.72 Will f/u renal function, micro data, and pt's clinical condition Vanc levels prn   Height: 5\' 5"  (165.1 cm) Weight: 72.6 kg (160 lb) IBW/kg (Calculated) : 61.5  Temp (24hrs), Avg:99.4 F (37.4 C), Min:98.7 F (37.1 C), Max:100.1 F (37.8 C)  Recent Labs  Lab 07-30-2020 0146 2020-07-30 0241 07-30-2020 0420 07/30/2020 0449  WBC 5.8  --   --  14.8*  CREATININE 1.72*  --   --   --   LATICACIDVEN  --  >11.0* >11.0*  --     Estimated Creatinine Clearance: 32.8 mL/min (A) (by C-G formula based on SCr of 1.72 mg/dL (H)).    No Known Allergies  Antimicrobials this admission: 4/15 Cefepime >>  4/15 Vanc >>   Microbiology results: 4/15 BCx:   UCx:    Thank you for allowing pharmacy to be a part of this patient's care.  5/15, PharmD, BCPS Please see amion for complete clinical pharmacist phone list 07/30/2020 6:17 AM

## 2020-07-24 NOTE — Consult Note (Signed)
Consultation Note Date: 07/29/2020   Patient Name: Lucas Garcia [id number]  DOB: 03-07-1946  MRN: [id number]  Age / Sex: 75 y.o., male  PCP: Pcp, No Referring Physician: Laurin Coder, MD  Reason for Consultation: Establishing goals of care, "sepsis"  HPI/Patient Profile: 75 y.o. male  with past medical history of atrial fibrillation on Xarelto, CAD, history of AAA s/p endovascular repair presented to the [facility] ED on 4/15 from home after an unwitnessed fall. Patient was being treated at the [facility] for two weeks for an "infection," but further details are unclear. [facility] Patient was admitted to [facility] on 07/23/2020 with shock (unclear if septic, hemorrhagic, hypovolemic), normocytic anemia with elevated INR, renal insufficiency, atrial fibrillation with RVR, encephalopathy.   Clinical Assessment and Goals of Care: I have reviewed medical records including EPIC notes, labs, and imaging. Received report from primary RN - no acute concerns. pRBCs were initiated and patient has levophed infusing.   Went to visit patient at bedside - no family/visitors present. Patient was lying in bed - he is lethargic but does wake to voice/gentle touch - he is oriented and able to participate in conversation but does fall asleep intermittently. No signs or non-verbal gestures of pain or discomfort noted. No respiratory distress, increased work of breathing, or secretions noted. Patient states he feels "relaxed" and denies pain or shortness of breath. He is on 4L O2 [place] and per RN this is chronic.  Met with patient  to discuss diagnosis, prognosis, GOC, EOL wishes, disposition, and options. We discussed having his wife present for [place] conversation and he wishes to wait to discuss [place] until she can be present.  Code status was discussed - patient is clear he does not want heroic measures taken to prolong his life in the event  his heart/lungs naturally stop. Patient is clear he wishes to be DNR/DNI with understanding that he would not receive CPR, defibrillation, ACLS medications, or intubation.   All questions and concerns addressed. Encouraged to call with questions and/or concerns.   Called patient's wife/[place]. I introduced Palliative Medicine as specialized medical care for people living with serious illness. It focuses on providing relief from the symptoms and stress of a serious illness. The goal is to improve quality of life for both the patient and the family. We discussed meeting for [place] conversations - she is agreeable. Unfortunately, she is not able to meet today, but can meet in person tomorrow 4/16 at 1:00pm. I reviewed patient's wishes for DNR/DNI - she confirms having discussion with patient in the past and these were his wishes - she is supportive. She understands that he would not receive CPR, defibrillation, ACLS medications, or intubation. Therapeutic listening provided as [place] reflects on how difficult the patient's medical situation has been over last several weeks.  All questions and concerns addressed. Encouraged to call with questions and/or concerns. PMT number provided.   Primary Decision Maker PATIENT    SUMMARY OF RECOMMENDATIONS:  Continue current medical treatment  Initiated  DNR/DNI - durable DNR form placed in shadow chart; copy was made and will be scanned into Vynca/ACP tab  PMT has scheduled [place] meeting with patient and wife in person tomorrow 4/16 at 1:00pm  PMT will continue to follow and support holistically   Code Status/Advance Care Planning:  DNR  Palliative Prophylaxis:   Aspiration, Bowel Regimen, Delirium Protocol, Frequent Pain Assessment, Oral Care and Turn Reposition  Additional Recommendations (Limitations, Scope, Preferences):  Full Scope Treatment and No Tracheostomy  Psycho-social/Spiritual:  Created space and opportunity for patient and family  to express thoughts and feelings regarding patient's current medical situation.   Emotional support and therapeutic listening provided.  Prognosis:  Poor in the setting of advanced age, recurrent hospitalizations, multiple comorbidities, and multi-organ system failure  Discharge Planning: To Be Determined      Primary Diagnoses: Present on Admission: . Sepsis ([place])   I have reviewed the medical record, interviewed the patient and family, and examined the patient. The following aspects are pertinent.  No past medical history on file. Social History   Socioeconomic History  . Marital status: Married    Spouse name: Not on file  . Number of children: Not on file  . Years of education: Not on file  . Highest education level: Not on file  Occupational History  . Not on file  Tobacco Use  . Smoking status: Not on file  . Smokeless tobacco: Not on file  Substance and Sexual Activity  . Alcohol use: Not on file  . Drug use: Not on file  . Sexual activity: Not on file  Other Topics Concern  . Not on file  Social History Narrative  . Not on file   Social Determinants of Health   Financial Resource Strain: Not on file  Food Insecurity: Not on file  Transportation Needs: Not on file  Physical Activity: Not on file  Stress: Not on file  Social Connections: Not on file   No family history on file. Scheduled Meds: . sodium chloride   Intravenous Once  . insulin aspart  0-9 Units Subcutaneous Q4H  . insulin aspart  5 Units Subcutaneous Once   Continuous Infusions: . sodium chloride Stopped (08/04/2020 0551)  . ceFEPime (MAXIPIME) IV    . lactated ringers 150 mL/hr at 07/20/2020 0900  . norepinephrine (LEVOPHED) Adult infusion 4 mcg/min (07/29/2020 0900)  . [START ON 07/25/2020] vancomycin     PRN Meds:.docusate sodium, polyethylene glycol Medications Prior to Admission:  Prior to Admission medications   Medication Sig Start Date End Date Taking? Authorizing Provider   ALPRAZolam (XANAX) 0.25 MG tablet Take 0.25 mg by mouth every 12 (twelve) hours as needed for anxiety. 07/15/20  Yes [provider]  atorvastatin (LIPITOR) 40 MG tablet Take 40 mg by mouth at bedtime. 03/03/20  Yes [provider]  chlorpheniramine (CHLOR-TRIMETON) 4 MG tablet Take 4 mg by mouth every 8 (eight) hours as needed for allergies. 10/10/19  Yes [provider]  Fluticasone-Salmeterol (ADVAIR) 250-50 MCG/DOSE AEPB Inhale 1 puff into the lungs in the morning and at bedtime. 02/19/20  Yes [provider]  furosemide (LASIX) 20 MG tablet Take 20 mg by mouth daily. 03/03/20  Yes [provider]  levothyroxine (SYNTHROID) 112 MCG tablet Take 112 mcg by mouth daily before breakfast. 06/29/20  Yes [provider]  metoprolol tartrate (LOPRESSOR) 100 MG tablet Take 100 mg by mouth in the morning and at bedtime. 04/21/20  Yes [provider]  rivaroxaban (XARELTO) 20 MG  TABS tablet Take 20 mg by mouth daily with supper. 03/31/20  Yes [provider]  traZODone (DESYREL) 50 MG tablet Take 25-50 mg by mouth See admin instructions. Take 25 mg every AM, and take 50 mg at bedtime for sleep and anxiety. 05/27/20  Yes [provider]   No Known Allergies Review of Systems  Unable to perform ROS: Acuity of condition    Physical Exam Vitals and nursing note reviewed.  Constitutional:      General: He is not in acute distress.    Appearance: He is cachectic. He is ill-appearing.  Pulmonary:     Effort: No respiratory distress.  Skin:    General: Skin is warm and dry.  Neurological:     Mental Status: He is oriented to person, place, and time. He is lethargic.     Motor: Weakness present.  Psychiatric:        Attention and Perception: Attention normal.        Behavior: Behavior is slowed. Behavior is cooperative.        Cognition and Memory: Cognition and memory normal.     Vital Signs: BP 114/70   Pulse (!) 107    Temp 98.1 F (36.7 C) (Axillary)   Resp (!) 22   Ht '5\' 5"'  (1.651 m)   Wt 72.6 kg   SpO2 97%   BMI 26.63 kg/m  Pain Scale: 0-10   Pain Score: 0-No pain   SpO2: SpO2: 97 % O2 Device:SpO2: 97 % O2 Flow Rate: .O2 Flow Rate (L/min): 4 L/min  IO: Intake/output summary:   Intake/Output Summary (Last 24 hours) at 07/22/2020 1000 Last data filed at 08/08/2020 0900 Gross per 24 hour  Intake 2449.7 ml  Output 0 ml  Net 2449.7 ml    LBM:   Baseline Weight: Weight: 72.6 kg Most recent weight: Weight: 72.6 kg     Palliative Assessment/Data: PPS 30%     Time In: 1000 Time Out: 1056 Time Total: 56 minutes  Greater than 50%  of this time was spent counseling and coordinating care related to the above assessment and plan.  Signed by: Lin Landsman, NP   Please contact Palliative Medicine Team phone at [phone] for questions and concerns.  For individual provider: See Shea Evans

## 2020-07-24 NOTE — ED Notes (Signed)
Notified Dr. Bebe Shaggy about hmg - 6.9

## 2020-07-24 NOTE — H&P (Addendum)
NAME:  Lucas Garcia, MRN:  [id number], DOB:  12-Oct-1945, LOS: 0 ADMISSION DATE:  2020/08/22, CONSULTATION DATE:  2020/08/22 REFERRING MD:  Bebe Shaggy, CHIEF COMPLAINT:  fall   History of Present Illness:  75 year old male with past medical history of atrial fibrillation on Xarelto, CAD, history of AAA s/p endovascular repair and hypothyroidism who was brought in after a fall at home.  History taken from ED and epic as patient is somewhat confused.  He apparently has been admitted at the [facility] for "2 weeks of an infection" and had left AGAINST MEDICAL ADVICE.  He arrived home the evening of April 14 and was later found disoriented on the floor after apparent fall.    He was tachycardic and hypotensive upon arrival, afebrile with labs significant for lactic acid > 11 and creatinine 1.7, anion gap metabolic acidosis, INR of 6 1, normal white blood count with hemoglobin of 6.9, marked polychromasia on differential.  Chest x-ray and CT scan head and C-spine without evidence of acute injury or source of infection.  CT abdomen/pelvis also without acute focus of bleed bleeding or source of sepsis.  He was given 2.5 L IV fluids, cefepime, Flagyl, vancomycin.  Blood pressure remained borderline, PCCM consulted for admission.  The patient is somewhat hard to understand, he denies any pain and is complaining only of being cold.  Pertinent  Medical History    Significant Hospital Events: Including procedures, antibiotic start and stop dates in addition to other pertinent events   . 4/15 Admit to PCCM, treated as septic in the ED, no source identified     Micro: 4/15 urine culture x1>> 4/15 blood culture x2>> 4/15 COVID-19>> negative   Antibiotics Cefepime 4/15- Vancomycin 4/15- Flagyl 4/15-  Interim History / Subjective:  As above  Objective   Blood pressure 102/67, pulse (!) 135, temperature 98.7 F (37.1 C), temperature source Oral, resp. rate (!) 22, height 5\' 5"  (1.651 m), weight 72.6 kg,  SpO2 99 %.        Intake/Output Summary (Last 24 hours) at Aug 22, 2020 0501 Last data filed at 08-22-2020 0316 Gross per 24 hour  Intake 0 ml  Output 0 ml  Net 0 ml   Filed Weights   08-22-20 0156  Weight: 72.6 kg    General: Thin, elderly male resting in bed in no distress HEENT: MM pink/moist, sclera anicteric, pupils even Neuro: Awake, tries to answer questions but appears somewhat encephalopathic, he is following commands and moving all extremities with no obvious facial droop or extremity weakness CV: s1s2 RRR, no m/r/g PULM: Trace expiratory wheezing, decreased air entry bilateral bases without significant rhonchi or rales.  No tachypnea or accessory muscle use GI: soft, bsx4 active, nontender on palpation Extremities: warm/dry, no edema  Skin: no rashes or lesions, tenting   Labs/imaging that I havepersonally reviewed  (right click and "Reselect all SmartList Selections" daily)  CT abdomen/pelvis, CBC, CMP, lactic acid, CXR  Resolved Hospital Problem list     Assessment & Plan:    Shock, unclear if septic, hemorrhagic or hypovolemic Recent reported admission to the 07/31/2020 for some kind of infection, unclear what the source was or how completely was treated.  No clear source on CXR CT abdomen.  He also does not have a clear source of bleeding, and duplicate chart it is noted he is on Xarelto and patient affirms that he is taking this Plan: -Maps in the 50s after 2.5 L IV fluid, he still appears volume down on exam, 1  additional liter LR and start peripheral Levophed to maintain MAP >65 -Awaiting urinalysis -Follow blood and urine cultures -Transfuse 1 unit PRBCs, no clear source of bleeding in the abdomen, check Hemoccult when patient has a bowel movement -Trend lactic acid -Continue empiric vancomycin and cefepime -Try to obtain records from [place]  Normocytic anemia with elevated INR PT and PTT are both elevated, patient has duplicate chart with noted Xarelto  prescription, he says he is taking, LFT's ok without hx of cirrhosis Differential with marked polychromasia and teardrop cells Plan: -Last labs were 2 years ago with hemoglobin 11, so unclear what his baseline hemoglobin is -Vitamin K 1 g -Hemoccult stool -Check LDH, haptoglobin, retic count, iron, ferritin and TIBC before transfusion -1 unit PRBCs -Peripheral smear -Follow CBC   Renal insufficiency, unclear chronicity Has not made any urine in the ED despite over 2 L IV fluid, kidneys unremarkable on CT Creatinine 1.7 with potassium 4.4 on admission Possibly volume depletion versus ATN from sepsis Plan: -Cath for UA, Check urine FENA -1 more liter IV fluid, check serial BMP, monitor renal indices and avoid nephrotoxins -Follow I's and O's   History of CAD, atrial fibrillation and AAA repair In A. fib with RVR on presentation, heart rate still 130 after IV fluid Per duplicate chart, patient is on metoprolol and Xarelto at home.  Endovascular repair without complication on CT abdomen/pelvis P: -Give additional liter IV fluid, monitor on telemetry, if remains in RVR may need amiodarone  -CT abdomen with some evidence of right heart failure, obtain echo - check troponin, BNP, magnesium  AGMA Bicarb 21 with gap of 17 Plan: -Suspect most likely related to lactic acidosis and renal failure, but check salicylate level -Repeat metabolic panel after IV fluid   Hyperglycemia With pseudohyponatremia, do not see prior diagnosis of diabetes and duplicate chart.  With AGMA and lactic acidosis consider new onset diabetes with DKA P: -Glucose 290 on presentation, check repeat and give 5 units regular insulin now -Start sliding scale and obtain beta hydroxybutyrate and serum Osms, A1c -Check blood gas  Encephalopathy CT head negative, is following commands but has difficulty clearly answering questions, possibly from sepsis and shock P: -Check ABG to ensure no hypercarbia -EtOH level  and UDS  Hypothyroidism Noted he is on Synthroid in duplicate chart, this will need to be verified by pharmacy P: -Check TSH  Best practice (right click and "Reselect all SmartList Selections" daily)  Diet:  NPO Pain/Anxiety/Delirium protocol (if indicated): No VAP protocol (if indicated): Not indicated DVT prophylaxis: SCD GI prophylaxis: N/A Glucose control:  SSI Yes Central venous access:  N/A Arterial line:  N/A Foley:  N/A Mobility:  bed rest  PT consulted: N/A Last date of multidisciplinary goals of care discussion [due 4/22], attempted to reach wife overnight but did not receive an answer at home number Code Status:  full code Disposition: ICU  Labs   CBC: Recent Labs  Lab 08/05/2020 0146  WBC 5.8  NEUTROABS 5.5  HGB 6.9*  HCT 24.5*  MCV 80.9  PLT 288    Basic Metabolic Panel: Recent Labs  Lab 08/03/2020 0146  NA 130*  K 4.4  CL 92*  CO2 21*  GLUCOSE 290*  BUN 38*  CREATININE 1.72*  CALCIUM 8.3*   GFR: Estimated Creatinine Clearance: 32.8 mL/min (A) (by C-G formula based on SCr of 1.72 mg/dL (H)). Recent Labs  Lab 07/16/2020 0146 07/17/2020 0241 08/05/2020 0420  WBC 5.8  --   --   LATICACIDVEN  --  >  11.0* >11.0*    Liver Function Tests: Recent Labs  Lab 07/28/20 0146  AST 22  ALT 15  ALKPHOS 97  BILITOT 1.9*  PROT 4.6*  ALBUMIN 2.0*   No results for input(s): LIPASE, AMYLASE in the last 168 hours. No results for input(s): AMMONIA in the last 168 hours.  ABG No results found for: PHART, PCO2ART, PO2ART, HCO3, TCO2, ACIDBASEDEF, O2SAT   Coagulation Profile: Recent Labs  Lab Jul 28, 2020 0146  INR 6.1*    Cardiac Enzymes: No results for input(s): CKTOTAL, CKMB, CKMBINDEX, TROPONINI in the last 168 hours.  HbA1C: No results found for: HGBA1C  CBG: No results for input(s): GLUCAP in the last 168 hours.  Review of Systems:   Unable to obtain secondary to encephalopathy  Past Medical History:  Atrial fibrillation, AAA, hypothyroidism,  CAD  Surgical History:  Endovascular AAA repair  Social History:      Family History:  Unknown, unable to obtain secondary to encephalopathy  Allergies No Known Allergies   Home Medications  Prior to Admission medications   Not on File     Critical care time: 65 minutes      CRITICAL CARE Performed by: Darcella Gasman Chrystina Naff   Total critical care time: 65 minutes  Critical care time was exclusive of separately billable procedures and treating other patients.  Critical care was necessary to treat or prevent imminent or life-threatening deterioration.  Critical care was time spent personally by me on the following activities: development of treatment plan with patient and/or surrogate as well as nursing, discussions with consultants, evaluation of patient's response to treatment, examination of patient, obtaining history from patient or surrogate, ordering and performing treatments and interventions, ordering and review of laboratory studies, ordering and review of radiographic studies, pulse oximetry and re-evaluation of patient's condition.  Darcella Gasman Jakwon Gayton, PA-C [facility] See Amion for pager If no response to pager , please call [phone] until 7pm After 7:00 pm call Elink  [phone]

## 2020-07-24 NOTE — Progress Notes (Signed)
  Echocardiogram 2D Echocardiogram has been performed with [facility].  Gerda Diss 07/23/2020, 6:03 PM

## 2020-07-24 NOTE — ED Provider Notes (Signed)
[facility] EMERGENCY DEPARTMENT Provider Note   CSN: [id number] Arrival date & time: August 08, 2020  0144     History Chief Complaint  Patient presents with  . level 2  . Fall   Level 5 caveat due to acuity of condition Lucas Garcia is a 75 y.o. male.  The history is provided by the patient and the EMS personnel.  Fall This is a new problem. The problem occurs constantly. The problem has been gradually worsening. Nothing aggravates the symptoms. Nothing relieves the symptoms.   Patient presents from home EMS reports patient was found on the floor and disoriented.  It appeared that he hit his head.  He does take Xarelto.  Apparently patient was just discharged AGAINST MEDICAL ADVICE at the [facility] and arrived home at 9 PM on April 14.  Apparently he was in the hospital for "2 weeks for an infection" but unclear what that infection was       PMH-unknown Soc hx - unknown  Home Medications Prior to Admission medications   Not on File    Allergies    Patient has no known allergies.  Review of Systems   Review of Systems  Unable to perform ROS: Acuity of condition    Physical Exam Updated Vital Signs BP 123/72   Pulse (!) 39   Temp 100.1 F (37.8 C) (Rectal)   Resp (!) 32   Ht 1.651 m ( )   Wt 72.6 kg   BMI 26.63 kg/m   Physical Exam CONSTITUTIONAL: Elderly and frail HEAD: Erythema noted to forehead, no signs of trauma EYES: EOMI/PERRL ENMT: Mucous membranes moist, no visible trauma NECK: supple no meningeal signs SPINE/BACK: Cervical spine tenderness, no thoracic or lumbar tenderness.  No bruising/crepitance/stepoffs noted to spine CV: Tachycardic LUNGS: Tachypnea, coarse breath sounds bilaterally, patient on high flow oxygen ABDOMEN: soft, nontender, no rebound or guarding, bowel sounds noted throughout abdomen GU -blood at urethral meatus Rectal -no blood or melena, nurse chaperone present NEURO: Pt is awake/alert/appropriate,  moves all extremitiesx4.  No facial droop.  GCS 15 EXTREMITIES: pulses normal/equal, full ROM, pelvis stable, all other extremities/joints palpated/ranged and nontender SKIN: warm, color normal PSYCH: Anxious  ED Results / Procedures / Treatments   Labs (all labs ordered are listed, but only abnormal results are displayed) Labs Reviewed  LACTIC ACID, PLASMA - Abnormal; Notable for the following components:      Result Value   Lactic Acid, Venous >11.0 (*)    All other components within normal limits  COMPREHENSIVE METABOLIC PANEL - Abnormal; Notable for the following components:   Sodium 130 (*)    Chloride 92 (*)    CO2 21 (*)    Glucose, Bld 290 (*)    BUN 38 (*)    Creatinine, Ser 1.72 (*)    Calcium 8.3 (*)    Total Protein 4.6 (*)    Albumin 2.0 (*)    Total Bilirubin 1.9 (*)    GFR, Estimated 41 (*)    Anion gap 17 (*)    All other components within normal limits  CBC WITH DIFFERENTIAL/PLATELET - Abnormal; Notable for the following components:   RBC 3.03 (*)    Hemoglobin 6.9 (*)    HCT 24.5 (*)    MCH 22.8 (*)    MCHC 28.2 (*)    RDW 19.5 (*)    nRBC 1.4 (*)    Lymphs Abs 0.2 (*)    nRBC 3 (*)    All  other components within normal limits  PROTIME-INR - Abnormal; Notable for the following components:   Prothrombin Time 54.3 (*)    INR 6.1 (*)    All other components within normal limits  APTT - Abnormal; Notable for the following components:   aPTT 46 (*)    All other components within normal limits  RESP PANEL BY RT-PCR (FLU A&B, COVID) [id number]  CULTURE, BLOOD (ROUTINE X 2)  CULTURE, BLOOD (ROUTINE X 2)  URINE CULTURE  LACTIC ACID, PLASMA  URINALYSIS, ROUTINE W REFLEX MICROSCOPIC  POC OCCULT BLOOD, ED  TYPE AND SCREEN    EKG  ED ECG REPORT   Date: 08/11/20 0154am  Rate: 164  Rhythm: atrial fibrillation  QRS Axis: right  Intervals: normal  ST/T Wave abnormalities: nonspecific ST changes  Conduction Disutrbances:none     I have personally  reviewed the EKG tracing and agree with the computerized printout as noted.  Radiology CT Head Wo Contrast  Result Date: Aug 11, 2020 CLINICAL DATA:  Unwitnessed fall, on throughout the EXAM: CT HEAD WITHOUT CONTRAST CT CERVICAL SPINE WITHOUT CONTRAST TECHNIQUE: Multidetector CT imaging of the head and cervical spine was performed following the standard protocol without intravenous contrast. Multiplanar CT image reconstructions of the cervical spine were also generated. COMPARISON:  None. FINDINGS: CT HEAD FINDINGS Brain: No evidence of acute infarction, hemorrhage, hydrocephalus, extra-axial collection, visible mass lesion or mass effect. Symmetric prominence of the ventricles, cisterns and sulci compatible with parenchymal volume loss. Patchy areas of white matter hypoattenuation are most compatible with chronic microvascular angiopathy. Vascular: Atherosclerotic calcification of the carotid siphons and intradural vertebral arteries. No hyperdense vessel. Gas within the cavernous sinuses may be related to intravenous access. Skull: No calvarial fracture or suspicious osseous lesion. No scalp swelling or hematoma. Sinuses/Orbits: Paranasal sinuses and mastoid air cells are predominantly clear. Orbital structures are unremarkable aside from prior lens extractions. Other: Absence of the majority of the dentition with only solitary remaining left maxillary cuspid and impacted right third mandibular molar. Dental prosthesis in place. CT CERVICAL SPINE FINDINGS Alignment: Slight exaggeration of the normal cervical lordosis. Mild rightward cranial rotation. No evidence of traumatic listhesis. No abnormally widened, perched or jumped facets. Normal alignment of the craniocervical and atlantoaxial articulations accounting for cranial positioning. Skull base and vertebrae: Motion degraded exam as well as photon starvation below the level of C5 may limit detection of subtle fractures or osseous abnormalities. Arthrosis,  erosive changes and calcific pannus formation posterior to the dens, nonspecific but often seen with rheumatoid or CPPD arthropathy. No acute skull base fracture. No vertebral body fracture or height loss. Normal bone mineralization. No other worrisome osseous lesions. Additional cervical spondylitic changes as below. Soft tissues and spinal canal: No pre or paravertebral fluid or swelling. No visible canal hematoma. Airways patent. Disc levels: Multilevel intervertebral disc height loss with spondylitic endplate changes. Degenerative spurring most pronounced anteriorly. Disc osteophyte complex C3-4 result in some partial effacement of the ventral thecal sac without significant canal impingement. Additional uncinate spurring facet hypertrophic changes are present throughout the cervical levels resulting in mild-to-moderate foraminal narrowing C2-C4. Upper chest: Apical scarring and emphysematous changes. Calcifications of the aortic arch and proximal great vessels as well as the cervical carotids. Prior sternotomy. Some additional foci of gas noted in the right brachiocephalic vein. Other: Diminutive or absent thyroid gland. No concerning thyroid nodules or masses. IMPRESSION: 1. No evidence of acute intracranial abnormality. 2. Mild parenchymal volume loss and chronic microvascular ischemic white matter disease. 3. Gas within  the cavernous sinuses and right brachiocephalic vein may be related to intravenous access. 4. No evidence of acute fracture or traumatic listhesis of the cervical spine. 5. Motion degraded exam as well as photon starvation below the level of C5 may limit detection of subtle fractures or osseous abnormalities. 6. Multilevel degenerative changes of the cervical spine, as above. 7. Erosive changes with athero cysts and calcific pannus formation at the dens is overall nonspecific though can be seen with CPPD or rheumatoid arthropathy. 8. Absence of the majority of the dentition with only solitary  remaining left maxillary cuspid and impacted right third mandibular molar. Correlate with dental exam. 9. Aortic Atherosclerosis (ICD10-I70.0). Electronically Signed   By: Kreg ShropshirePrice  DeHay M.D.   On: 2021-02-18 03:19   CT Cervical Spine Wo Contrast  Result Date: Jun 27, 2020 CLINICAL DATA:  Unwitnessed fall, on throughout the EXAM: CT HEAD WITHOUT CONTRAST CT CERVICAL SPINE WITHOUT CONTRAST TECHNIQUE: Multidetector CT imaging of the head and cervical spine was performed following the standard protocol without intravenous contrast. Multiplanar CT image reconstructions of the cervical spine were also generated. COMPARISON:  None. FINDINGS: CT HEAD FINDINGS Brain: No evidence of acute infarction, hemorrhage, hydrocephalus, extra-axial collection, visible mass lesion or mass effect. Symmetric prominence of the ventricles, cisterns and sulci compatible with parenchymal volume loss. Patchy areas of white matter hypoattenuation are most compatible with chronic microvascular angiopathy. Vascular: Atherosclerotic calcification of the carotid siphons and intradural vertebral arteries. No hyperdense vessel. Gas within the cavernous sinuses may be related to intravenous access. Skull: No calvarial fracture or suspicious osseous lesion. No scalp swelling or hematoma. Sinuses/Orbits: Paranasal sinuses and mastoid air cells are predominantly clear. Orbital structures are unremarkable aside from prior lens extractions. Other: Absence of the majority of the dentition with only solitary remaining left maxillary cuspid and impacted right third mandibular molar. Dental prosthesis in place. CT CERVICAL SPINE FINDINGS Alignment: Slight exaggeration of the normal cervical lordosis. Mild rightward cranial rotation. No evidence of traumatic listhesis. No abnormally widened, perched or jumped facets. Normal alignment of the craniocervical and atlantoaxial articulations accounting for cranial positioning. Skull base and vertebrae: Motion  degraded exam as well as photon starvation below the level of C5 may limit detection of subtle fractures or osseous abnormalities. Arthrosis, erosive changes and calcific pannus formation posterior to the dens, nonspecific but often seen with rheumatoid or CPPD arthropathy. No acute skull base fracture. No vertebral body fracture or height loss. Normal bone mineralization. No other worrisome osseous lesions. Additional cervical spondylitic changes as below. Soft tissues and spinal canal: No pre or paravertebral fluid or swelling. No visible canal hematoma. Airways patent. Disc levels: Multilevel intervertebral disc height loss with spondylitic endplate changes. Degenerative spurring most pronounced anteriorly. Disc osteophyte complex C3-4 result in some partial effacement of the ventral thecal sac without significant canal impingement. Additional uncinate spurring facet hypertrophic changes are present throughout the cervical levels resulting in mild-to-moderate foraminal narrowing C2-C4. Upper chest: Apical scarring and emphysematous changes. Calcifications of the aortic arch and proximal great vessels as well as the cervical carotids. Prior sternotomy. Some additional foci of gas noted in the right brachiocephalic vein. Other: Diminutive or absent thyroid gland. No concerning thyroid nodules or masses. IMPRESSION: 1. No evidence of acute intracranial abnormality. 2. Mild parenchymal volume loss and chronic microvascular ischemic white matter disease. 3. Gas within the cavernous sinuses and right brachiocephalic vein may be related to intravenous access. 4. No evidence of acute fracture or traumatic listhesis of the cervical spine. 5. Motion degraded  exam as well as photon starvation below the level of C5 may limit detection of subtle fractures or osseous abnormalities. 6. Multilevel degenerative changes of the cervical spine, as above. 7. Erosive changes with athero cysts and calcific pannus formation at the dens  is overall nonspecific though can be seen with CPPD or rheumatoid arthropathy. 8. Absence of the majority of the dentition with only solitary remaining left maxillary cuspid and impacted right third mandibular molar. Correlate with dental exam. 9. Aortic Atherosclerosis (ICD10-I70.0). Electronically Signed   By: Kreg Shropshire M.D.   On: 10-Aug-2020 03:19   DG Chest Port 1 View  Result Date: 08/10/2020 CLINICAL DATA:  Fall EXAM: PORTABLE CHEST 1 VIEW COMPARISON:  05/07/2018 FINDINGS: The heart size and mediastinal contours are within normal limits. Both lungs are clear. The visualized skeletal structures are unremarkable. Remote median sternotomy. IMPRESSION: No active disease. Electronically Signed   By: Deatra Robinson M.D.   On: Aug 10, 2020 02:33    Procedures .Critical Care Performed by: Zadie Rhine, MD Authorized by: Zadie Rhine, MD   Critical care provider statement:    Critical care time (minutes):  120   Critical care start time:  10-Aug-2020 2:18 AM   Critical care end time:  08/10/20 4:18 AM   Critical care time was exclusive of:  Separately billable procedures and treating other patients   Critical care was necessary to treat or prevent imminent or life-threatening deterioration of the following conditions:  Sepsis, trauma, shock, metabolic crisis and circulatory failure   Critical care was time spent personally by me on the following activities:  Development of treatment plan with patient or surrogate, discussions with consultants, evaluation of patient's response to treatment, examination of patient, re-evaluation of patient's condition, pulse oximetry, ordering and review of radiographic studies, ordering and review of laboratory studies, review of old charts, ordering and performing treatments and interventions and obtaining history from patient or surrogate   I assumed direction of critical care for this patient from another provider in my specialty: no     Care discussed with:  admitting provider       Medications Ordered in ED Medications  lactated ringers infusion ( Intravenous New Bag/Given 08/10/2020 0327)  lactated ringers bolus 1,000 mL (0 mLs Intravenous Stopped 08-10-20 0307)    And  lactated ringers bolus 1,000 mL (0 mLs Intravenous Stopped 08/10/20 0234)    And  lactated ringers bolus 250 mL (has no administration in time range)  vancomycin (VANCOREADY) IVPB 1000 mg/200 mL (1,000 mg Intravenous New Bag/Given 2020/08/10 0325)  ceFEPIme (MAXIPIME) 2 g in sodium chloride 0.9 % 100 mL IVPB (0 g Intravenous Stopped 2020/08/10 0307)  metroNIDAZOLE (FLAGYL) IVPB 500 mg (0 mg Intravenous Stopped Aug 10, 2020 0327)  acetaminophen (TYLENOL) suppository 650 mg (650 mg Rectal Given 08/10/20 0301)    ED Course  I have reviewed the triage vital signs and the nursing notes.  Pertinent labs & imaging results that were available during my care of the patient were reviewed by me and considered in my medical decision making (see chart for details).    MDM Rules/Calculators/A&P                          2:19 AM Patient seen on arrival as a level 2 trauma.  Patient is on anticoagulation had evidence of head injury.  However patient does appear to be septic with rectal temp of 100.1, he is tachycardic.  Code sepsis has been called.  Imaging  has been ordered.  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  The patient has a new abdominal pain or hypotension he may require CT imaging.  However I suspect most of his issues are being driven by sepsis .2:49 AM Wife arrived.  She had very little detail about his 2-week stay at the [facility].  Reports he had some sort of "infection in his colon" Patient denies any abdominal pain or diarrhea at this time.  Patient appears to be in atrial fibrillation with rapid ventricular response.  Plan will be to give IV fluids, then if this does not improve his heart rate he will be started on a Cardizem drip.  Labs and  imaging are pending at this time 3:40 AM No acute findings on CT head or C-spine.  Patient does appear to have anemia Per nursing, during rectal exam there was no blood or melena Labs are pending at this time 4:19 AM Patient with significant lab abnormalities including lactate over 11 as well as INR of 6.  Patient does not take Coumadin but does take a DOAC Patient denies any abdominal pain, but given previous history of vascular disease, he will need to undergo CT imaging to evaluate for any ischemic bowel. Emergent CT angio of his abdomen pelvis has been ordered. Patient has already received IV fluids and IV antibiotics Type and screen has been ordered and patient may need blood products. No obvious source of acute anemia but will await CT imaging results. Discussed with critical care for admission Final Clinical Impression(s) / ED Diagnoses Final diagnoses:  Atrial fibrillation with rapid ventricular response (HCC)  Concussion with loss of consciousness, initial encounter  Strain of neck muscle, initial encounter  AKI (acute kidney injury) (HCC)  Acute anemia    Rx / DC Orders ED Discharge Orders    None       Zadie Rhine, MD 08/02/2020 [phone]

## 2020-07-24 NOTE — Sepsis Progress Note (Signed)
Following for Code Sepsis  

## 2020-07-24 NOTE — Progress Notes (Signed)
eLink Physician-Brief Progress Note Patient Name: BUCKLEY BRADLY DOB: [birth year] MRN: [id number]   Date of Service  31-Jul-2020  HPI/Events of Note  Patient with atrial fibrillation with RVR (rates 150's), he is on 100 mg of Lopressor daily at home to control his rate but he is currently requiring Norepinephrine for presumed septic shock.  eICU Interventions  Amiodarone bolus + infusion ordered.        Thomasene Lot Aarica Wax 07/31/2020, 9:18 PM

## 2020-07-24 NOTE — ED Triage Notes (Signed)
Pt presents to ED BIB [facility] from home. Pt found on floor, disoriented, lower back pain. Pt had unwitnessed fall, pt taking xerelto. Pt left AMA from [facility] hospital this evening. Admitted for 2w for "infection" per family  HR - 130-180 a. Fib rvr RR - 30 O2 - 86% ETCO2 - 17 CBG - 438

## 2020-07-25 DIAGNOSIS — R319 Hematuria, unspecified: Secondary | ICD-10-CM

## 2020-07-25 DIAGNOSIS — R6520 Severe sepsis without septic shock: Secondary | ICD-10-CM

## 2020-07-25 DIAGNOSIS — R52 Pain, unspecified: Secondary | ICD-10-CM

## 2020-07-25 DIAGNOSIS — A419 Sepsis, unspecified organism: Secondary | ICD-10-CM | POA: Diagnosis not present

## 2020-07-25 DIAGNOSIS — S060X9A Concussion with loss of consciousness of unspecified duration, initial encounter: Secondary | ICD-10-CM | POA: Diagnosis not present

## 2020-07-25 DIAGNOSIS — I4891 Unspecified atrial fibrillation: Secondary | ICD-10-CM | POA: Diagnosis not present

## 2020-07-25 DIAGNOSIS — D649 Anemia, unspecified: Secondary | ICD-10-CM | POA: Diagnosis not present

## 2020-07-25 DIAGNOSIS — N179 Acute kidney failure, unspecified: Secondary | ICD-10-CM | POA: Diagnosis not present

## 2020-07-25 LAB — PROTIME-INR
INR: 1.2 (ref 0.8–1.2)
Prothrombin Time: 15.1 seconds (ref 11.4–15.2)

## 2020-07-25 LAB — FRACTIONAL EXCRET-NA(24HR UR +SERUM)
Creatinine, Ser: 1.01 mg/dL (ref 0.61–1.24)
Creatinine, Urine: 83.07 mg/dL
Fractional Excretion of Na: 1 %
Sodium, Ur: 78 mmol/L
Sodium: 136 mmol/L (ref 135–145)

## 2020-07-25 LAB — TYPE AND SCREEN
ABO/RH(D): A POS
Antibody Screen: NEGATIVE
Unit division: 0
Unit division: 0

## 2020-07-25 LAB — BPAM RBC
Blood Product Expiration Date: 202205072359
Blood Product Expiration Date: 202205082359
ISSUE DATE / TIME: 202204151002
ISSUE DATE / TIME: 202204151517
Unit Type and Rh: 6200
Unit Type and Rh: 6200

## 2020-07-25 LAB — GLUCOSE, CAPILLARY
Glucose-Capillary: 109 mg/dL — ABNORMAL HIGH (ref 70–99)
Glucose-Capillary: 113 mg/dL — ABNORMAL HIGH (ref 70–99)
Glucose-Capillary: 116 mg/dL — ABNORMAL HIGH (ref 70–99)
Glucose-Capillary: 121 mg/dL — ABNORMAL HIGH (ref 70–99)
Glucose-Capillary: 95 mg/dL (ref 70–99)

## 2020-07-25 LAB — HAPTOGLOBIN: Haptoglobin: 305 mg/dL (ref 34–355)

## 2020-07-25 MED ORDER — GLYCOPYRROLATE 0.2 MG/ML IJ SOLN
0.2000 mg | INTRAMUSCULAR | Status: DC | PRN
  Administered 2020-07-26 (×2): 0.2 mg via INTRAVENOUS
  Filled 2020-07-25 (×2): qty 1

## 2020-07-25 MED ORDER — GLYCOPYRROLATE 0.2 MG/ML IJ SOLN: 0.2000 mg | INTRAMUSCULAR | Status: DC | PRN

## 2020-07-25 MED ORDER — HYDROCODONE-ACETAMINOPHEN 5-325 MG PO TABS
1.0000 | ORAL_TABLET | Freq: Four times a day (QID) | ORAL | Status: DC | PRN
  Administered 2020-07-25: 1 via ORAL
  Filled 2020-07-25: qty 1

## 2020-07-25 MED ORDER — HYDROCODONE-ACETAMINOPHEN 5-325 MG PO TABS: 1.0000 | ORAL_TABLET | Freq: Four times a day (QID) | ORAL | Status: DC | PRN

## 2020-07-25 MED ORDER — ACETAMINOPHEN 500 MG PO TABS
1000.0000 mg | ORAL_TABLET | Freq: Three times a day (TID) | ORAL | Status: DC
  Administered 2020-07-25 – 2020-07-26 (×2): 1000 mg via ORAL
  Filled 2020-07-25 (×2): qty 2

## 2020-07-25 MED ORDER — LORAZEPAM 2 MG/ML IJ SOLN
1.0000 mg | INTRAMUSCULAR | Status: DC | PRN
  Administered 2020-07-25 – 2020-07-26 (×3): 1 mg via INTRAVENOUS
  Filled 2020-07-25 (×3): qty 1

## 2020-07-25 MED ORDER — POLYVINYL ALCOHOL 1.4 % OP SOLN
1.0000 [drp] | Freq: Four times a day (QID) | OPHTHALMIC | Status: DC | PRN
  Filled 2020-07-25: qty 15

## 2020-07-25 MED ORDER — HALOPERIDOL LACTATE 5 MG/ML IJ SOLN
2.0000 mg | Freq: Four times a day (QID) | INTRAMUSCULAR | Status: DC | PRN
  Administered 2020-07-26: 2 mg via INTRAVENOUS
  Filled 2020-07-25: qty 1

## 2020-07-25 MED ORDER — METOPROLOL TARTRATE 5 MG/5ML IV SOLN
2.5000 mg | INTRAVENOUS | Status: DC | PRN
  Administered 2020-07-25: 5 mg via INTRAVENOUS
  Filled 2020-07-25: qty 5

## 2020-07-25 MED ORDER — ONDANSETRON 4 MG PO TBDP: 4.0000 mg | ORAL_TABLET | Freq: Four times a day (QID) | ORAL | Status: DC | PRN

## 2020-07-25 MED ORDER — LORAZEPAM 1 MG PO TABS
1.0000 mg | ORAL_TABLET | ORAL | Status: DC | PRN
  Administered 2020-07-25: 1 mg via ORAL
  Filled 2020-07-25: qty 1

## 2020-07-25 MED ORDER — ACETAMINOPHEN 650 MG RE SUPP: 650.0000 mg | Freq: Four times a day (QID) | RECTAL | Status: DC | PRN

## 2020-07-25 MED ORDER — MORPHINE SULFATE (PF) 2 MG/ML IV SOLN
2.0000 mg | INTRAVENOUS | Status: DC | PRN
  Administered 2020-07-25: 2 mg via INTRAVENOUS
  Administered 2020-07-25: 4 mg via INTRAVENOUS
  Administered 2020-07-25 – 2020-07-26 (×3): 2 mg via INTRAVENOUS
  Administered 2020-07-26: 4 mg via INTRAVENOUS
  Filled 2020-07-25: qty 2
  Filled 2020-07-25: qty 1
  Filled 2020-07-25: qty 2
  Filled 2020-07-25 (×2): qty 1

## 2020-07-25 MED ORDER — METOPROLOL TARTRATE 25 MG PO TABS
50.0000 mg | ORAL_TABLET | Freq: Three times a day (TID) | ORAL | Status: DC
  Administered 2020-07-25: 50 mg via ORAL
  Filled 2020-07-25: qty 2

## 2020-07-25 MED ORDER — HALOPERIDOL LACTATE 2 MG/ML PO CONC
2.0000 mg | Freq: Four times a day (QID) | ORAL | Status: DC | PRN
  Filled 2020-07-25: qty 1

## 2020-07-25 MED ORDER — ALPRAZOLAM 0.5 MG PO TABS
0.2500 mg | ORAL_TABLET | Freq: Two times a day (BID) | ORAL | Status: DC | PRN
  Administered 2020-07-25: 0.25 mg via ORAL
  Filled 2020-07-25: qty 1

## 2020-07-25 MED ORDER — HALOPERIDOL 1 MG PO TABS
2.0000 mg | ORAL_TABLET | Freq: Four times a day (QID) | ORAL | Status: DC | PRN
  Filled 2020-07-25: qty 2

## 2020-07-25 MED ORDER — ONDANSETRON HCL 4 MG/2ML IJ SOLN: 4.0000 mg | Freq: Four times a day (QID) | INTRAMUSCULAR | Status: DC | PRN

## 2020-07-25 MED ORDER — ACETAMINOPHEN 325 MG PO TABS: 650.0000 mg | ORAL_TABLET | Freq: Four times a day (QID) | ORAL | Status: DC | PRN

## 2020-07-25 MED ORDER — GLYCOPYRROLATE 1 MG PO TABS
1.0000 mg | ORAL_TABLET | ORAL | Status: DC | PRN
Start: 2020-07-25 — End: 2020-07-27
  Filled 2020-07-25: qty 1

## 2020-07-25 MED ORDER — BIOTENE DRY MOUTH MT LIQD
15.0000 mL | Freq: Two times a day (BID) | OROMUCOSAL | Status: DC
  Administered 2020-07-25 – 2020-07-26 (×2): 15 mL via TOPICAL

## 2020-07-25 MED ORDER — LORAZEPAM 2 MG/ML PO CONC
1.0000 mg | ORAL | Status: DC | PRN
  Administered 2020-07-25 – 2020-07-26 (×2): 1 mg via SUBLINGUAL
  Filled 2020-07-25 (×2): qty 1

## 2020-07-25 MED ORDER — LACTATED RINGERS IV BOLUS
1000.0000 mL | Freq: Once | INTRAVENOUS | Status: AC
  Administered 2020-07-25: 1000 mL via INTRAVENOUS

## 2020-07-25 NOTE — Progress Notes (Signed)
NAME:  Lucas Garcia, MRN:  [id number], DOB:  [birth year], LOS: 1 ADMISSION DATE:  08/13/2020, CONSULTATION DATE:  07/25/20 REFERRING MD:  Bebe Shaggy, CHIEF COMPLAINT:  fall   History of Present Illness:  75 year old male with past medical history of atrial fibrillation on Xarelto, CAD, history of AAA s/p endovascular repair and hypothyroidism who was brought in after a fall at home.  History taken from ED and epic as patient is somewhat confused.  He apparently has been admitted at the [facility] for "2 weeks of an infection" and had left AGAINST MEDICAL ADVICE.  He arrived home the evening of April 14 and was later found disoriented on the floor after apparent fall.    He was tachycardic and hypotensive upon arrival, afebrile with labs significant for lactic acid > 11 and creatinine 1.7, anion gap metabolic acidosis, INR of 6 1, normal white blood count with hemoglobin of 6.9, marked polychromasia on differential.  Chest x-ray and CT scan head and C-spine without evidence of acute injury or source of infection.  CT abdomen/pelvis also without acute focus of bleed bleeding or source of sepsis.  He was given 2.5 L IV fluids, cefepime, Flagyl, vancomycin.  Blood pressure remained borderline, PCCM consulted for admission.  The patient is somewhat hard to understand, he denies any pain and is complaining only of being cold.  Significant Hospital Events: Including procedures, antibiotic start and stop dates in addition to other pertinent events   . 4/15 Admit to PCCM, treated as septic in the ED, no source identified   Interim History / Subjective:  Blood culture grew E. coli, pansensitive, came off vasopressors Went into A. fib with RVR, requiring amiodarone infusion  Objective   Blood pressure 135/90, pulse (!) 135, temperature (!) 97.5 F (36.4 C), temperature source Oral, resp. rate 19, height 5\' 5"  (1.651 m), weight 72.6 kg, SpO2 100 %.        Intake/Output Summary (Last 24 hours) at 07/25/2020  1016 Last data filed at 07/25/2020 0900 Gross per 24 hour  Intake 3434.86 ml  Output 2005 ml  Net 1429.86 ml   Filed Weights   2020-08-13 0156  Weight: 72.6 kg    General: Chronically ill-appearing elderly Caucasian male, lying on the bed HEENT: MM pink/moist, sclera anicteric, dry mucous membranes Neuro: Awake, alert, following commands, moving all 4 extremities aphasic CV: Irregularly irregular, no murmur or gallop PULM: Trace expiratory wheezing, decreased air entry bilateral bases without significant rhonchi or rales. GI: soft, bsx4 active, nontender on palpation Extremities: warm/dry, no edema  Skin: no rashes or lesions, tenting  Labs/imaging that I have personally reviewed    Serum sodium 133 Creatinine 1.24  Resolved Hospital Problem list   Septic shock High anion gap metabolic acidosis Assessment & Plan:  Sepsis due to E. coli bacteremia likely urinary source Patient was [facility] for some kind of unclear infection He presented with generalized weakness, found to be hypotensive even after IV fluid resuscitation Requiring IV Levophed infusion Blood culture grew E. coli IV vancomycin and azithromycin was discontinued Continue IV ceftriaxone  Acute blood loss anemia due to urethral bleeding (traumatic hematuria) Patient pulled out Foley catheter at the [facility], when he presented had frank hematuria Foley catheter was placed, initially large clots came out Received 2 units of PRBCs Now urine is clear Urology input is appreciated recommend keeping Foley catheter in for 5 days then giving voiding trial Monitor H&H  Coagulopathy with high INR in the setting of Xarelto therapy  Patient presented with elevated INR, he takes Xarelto at home for paroxysmal A. fib Anticoagulation is on hold He received vitamin IV K x2 Monitor INR   Acute kidney injury Patient presented with serum creatinine of 1.7 After IV fluid resuscitation, serum creatinine trended down to  1.2 Closely monitor intake and output  Proximal A. fib with RVR Patient's went into rapid ventricular response last night requiring IV amiodarone infusion even with that heart rate is not well controlled Started on p.o. metoprolol Anticoagulation is on hold for supratherapeutic INR  CAD and AAA repair Closely monitor  Septic Encephalopathy CT head negative, is following commands but has difficulty clearly answering questions, now mental status is improved  Best practice (right click and "Reselect all SmartList Selections" daily)  Diet: Regular diet Pain/Anxiety/Delirium protocol (if indicated): No VAP protocol (if indicated): Not indicated DVT prophylaxis: SCD GI prophylaxis: N/A Glucose control:  SSI Yes Central venous access:  N/A Arterial line:  N/A Foley:  N/A Mobility:  bed rest  PT consulted: N/A Last date of multidisciplinary goals of care discussion 4/15, with palliative care team.  Patient is DNR/DNI.  Family meeting is arranged for 4/16 at 1 PM Code Status: DNR/DNI Disposition: ICU, probably can go to progressive care once off amiodarone infusion and heart rate is better controlled  Labs   CBC: Recent Labs  Lab Aug 01, 2020 0146 08/01/20 0449 August 01, 2020 0646 08-01-20 2038  WBC 5.8 14.8*  --   --   NEUTROABS 5.5  --   --   --   HGB 6.9* 7.5* 6.8* 8.7*  HCT 24.5* 26.1* 20.0* 28.0*  MCV 80.9 79.6*  --   --   PLT 288 321  --   --     Basic Metabolic Panel: Recent Labs  Lab 08-01-2020 0146 Aug 01, 2020 0449 2020-08-01 0509 Aug 01, 2020 0517 Aug 01, 2020 0646 01-Aug-2020 1443 07/25/20 0909  NA 130* 132*  --  TEST REQUEST RECEIVED WITHOUT APPROPRIATE SPECIMEN 132* 133* 136  K 4.4 4.4  --   --  4.1 4.1  --   CL 92* 91*  --   --   --  96*  --   CO2 21* 26  --   --   --  27  --   GLUCOSE 290* 187*  --   --   --  169*  --   BUN 38* 39*  --   --   --  33*  --   CREATININE 1.72* 1.75*  --  TEST REQUEST RECEIVED WITHOUT APPROPRIATE SPECIMEN  --  1.24 1.01  CALCIUM 8.3* 8.4*  --   --    --  8.1*  --   MG  --  1.6* 1.6*  --   --   --   --   PHOS  --  4.6  --   --   --   --   --    GFR: Estimated Creatinine Clearance: 55.8 mL/min (by C-G formula based on SCr of 1.01 mg/dL). Recent Labs  Lab Aug 01, 2020 0146 08-01-20 0241 Aug 01, 2020 0420 Aug 01, 2020 0449 08-01-2020 1443  WBC 5.8  --   --  14.8*  --   LATICACIDVEN  --  >11.0* >11.0*  --  1.9    Liver Function Tests: Recent Labs  Lab 01-Aug-2020 0146  AST 22  ALT 15  ALKPHOS 97  BILITOT 1.9*  PROT 4.6*  ALBUMIN 2.0*   No results for input(s): LIPASE, AMYLASE in the last 168 hours. No results for input(s): AMMONIA in the last  168 hours.  ABG    Component Value Date/Time   PHART 7.458 (H) 11-Aug-2020 0646   PCO2ART 39.3 08/11/20 0646   PO2ART 58 (L) 11-Aug-2020 0646   HCO3 27.8 08-11-2020 0646   TCO2 29 08/11/20 0646   O2SAT 91.0 08-11-2020 0646     Coagulation Profile: Recent Labs  Lab Aug 11, 2020 0146  INR 6.1*    Cardiac Enzymes: No results for input(s): CKTOTAL, CKMB, CKMBINDEX, TROPONINI in the last 168 hours.  HbA1C: Hgb A1c MFr Bld  Date/Time Value Ref Range Status  2020/08/11 05:20 AM 7.0 (H) 4.8 - 5.6 % Final    Comment:    (NOTE) Pre diabetes:          5.7%-6.4%  Diabetes:              >6.4%  Glycemic control for   <7.0% adults with diabetes     CBG: Recent Labs  Lab 11-Aug-2020 1517 Aug 11, 2020 1951 07/25/20 0007 07/25/20 0402 07/25/20 0726  GLUCAP 161* 106* 121* 116* 113*    Total critical care time: 46 minutes  Performed by: Cheri Fowler   Critical care time was exclusive of separately billable procedures and treating other patients.   Critical care was necessary to treat or prevent imminent or life-threatening deterioration.   Critical care was time spent personally by me on the following activities: development of treatment plan with patient and/or surrogate as well as nursing, discussions with consultants, evaluation of patient's response to treatment, examination of  patient, obtaining history from patient or surrogate, ordering and performing treatments and interventions, ordering and review of laboratory studies, ordering and review of radiographic studies, pulse oximetry and re-evaluation of patient's condition.   Cheri Fowler MD [place] [facility] See Amion for pager If no response to pager, please call [phone] until 7pm After 7pm, Please call E-link [phone]

## 2020-07-25 NOTE — Progress Notes (Signed)
Daily Progress Note   Patient Name: Lucas Garcia       Date: 07/25/2020 DOB: Mar 22, 1946  Age: 75 y.o. MRN#: [id number] Attending Physician: Jacky Kindle, MD Primary Care Physician: Pcp, No Admit Date: 07/29/2020  Reason for Consultation/Follow-up: Establishing goals of care  Subjective: Chart review performed.  Received report from primary RN -no acute concerns.  RN reports patient has been weaned off Levophed and amiodarone, now on metoprolol for heart rate control.  RN also states patient has poor oral intake.  Went to visit patient at bedside -wife/Lucas Garcia, son/Lucas Garcia, daughter/Lucas Garcia were present.  Patient was lying in bed awake, alert, oriented, and able to participate in conversation. Signs and non-verbal gestures of pain and discomfort noted. No respiratory distress, increased work of breathing, or secretions noted.  Patient states his main concern is the pain in his back.  He denies shortness of breath.  He is on 2 L nasal cannula.  Met with patient,wife/Lucas Garcia, son/Lucas Garcia, and daughter/Lucas Garcia to discuss diagnosis, prognosis, GOC, EOL wishes, disposition, and options.  I introduced Palliative Medicine as specialized medical care for people living with serious illness. It focuses on providing relief from the symptoms and stress of a serious illness. The goal is to improve quality of life for both the patient and the family.  We discussed a brief life review of the patient as well as functional and nutritional status.  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  He and his wife of 63 years have 1 son and 1 daughter.  Prior to hospitalization patient was living with his wife in a private residence.  He was mostly functionally  independent -he could ambulate independently with a cane and tells me he could dress, bathe, and cook for himself with minimal assistance.  Patient was receiving PT 2 times per week prior to his admission at the [place].  Patient tells me he has had a poor appetite for the past several months.  Family explained they have seen a slow decline in the patient and describe his decline as more rapid over the last 3 weeks.  They state he has not been eating or drinking and has become increasingly weaker.  We discussed patient's current illness and what it means in the larger  context of patient's on-going co-morbidities.  Family did not have a clear understanding of patient's current medical situation -reviewed patient's interval history since admission.  We discussed patient's malnutrition as and overall poor prognostic indicator. Natural disease trajectory and expectations at EOL were discussed. I attempted to elicit values and goals of care important to the patient. The difference between aggressive medical intervention and comfort care was considered in light of the patient's goals of care.  Lucas Garcia is very clear and his goals today -he wants to focus solely on his quality of life and wants to discontinue all life prolonging interventions.  The patient states, he is suffering and says " this is not living" -patient feels miserable.  Therapeutic listening provided as patient reflects on the last several weeks and how hard it has been for him. Patient states that he wants to die today -educated patient that we cannot legally or ethically hasten his death but can absolutely focus on keeping him comfortable for the time that he is has left, letting nature take its course. We talked about transition to comfort measures in house and what that would entail inclusive of medications to control pain, dyspnea, agitation, nausea, itching, and hiccups. We discussed stopping all unnecessary measures such as blood draws, needle  sticks, oxygen, antibiotics, CBGs/insulin, cardiac monitoring, and frequent vital signs.  Patient is agreeable for transition to full comfort measures in house today.  Provided education and counseling at length on the philosophy and benefits of hospice care. Discussed that it offers a holistic approach to care in the setting of end-stage illness, and is about supporting the patient where they are allowing nature to take it's course. Discussed the hospice team includes RNs, physicians, social workers, and chaplains. They can provide personal care, support for the family, and help keep patient out of the hospital.  We reviewed in detail the difference between home hospice and residential hospice.  Prognostication was discussed.  Allowed space and opportunity for patient and family to express their thoughts and feelings around hospice care.  After detailed discussion patient and family are agreeable for transfer to residential hospice facility, requesting [facility]. Provided reassurance that residential hospice referral could be cancelled (would anticipate hospital death) at any time if patient's condition changed and it was felt they were too unstable for transfer.  Family were understandably tearful during conversation today, but are supportive of patient's goals and wishes.  Visit also consisted of discussions dealing with the complex and emotionally intense issues of symptom management and palliative care in the setting of serious and life-threatening illness. Palliative care team will continue to support patient, patient's family, and medical team.  Discussed with patient/family the importance of continued conversation with each other and the medical providers regarding overall plan of care and treatment options, ensuring decisions are within the context of the patient's values and GOCs.    Questions and concerns were addressed. The patient/family was encouraged to call with questions and/or  concerns. PMT card was provided.    Length of Stay: 1  Current Medications: Scheduled Meds:  . sodium chloride   Intravenous Once  . sodium chloride   Intravenous Once  . chlorhexidine  15 mL Mouth Rinse BID  . Chlorhexidine Gluconate Cloth  6 each Topical Daily  . insulin aspart  0-9 Units Subcutaneous Q4H  . insulin aspart  5 Units Subcutaneous Once  . mouth rinse  15 mL Mouth Rinse q12n4p  . metoprolol tartrate  50 mg Oral Q8H  Continuous Infusions: . sodium chloride Stopped (08/08/2020 1517)  . cefTRIAXone (ROCEPHIN)  IV Stopped (07/25/20 0434)    PRN Meds: ALPRAZolam, docusate sodium, HYDROcodone-acetaminophen, polyethylene glycol  Physical Exam Vitals and nursing note reviewed.  Constitutional:      General: He is not in acute distress.    Appearance: He is ill-appearing.  Pulmonary:     Effort: No respiratory distress.  Skin:    General: Skin is warm and dry.  Neurological:     Mental Status: He is alert and oriented to person, place, and time.     Motor: Weakness present.  Psychiatric:        Attention and Perception: Attention normal.        Behavior: Behavior is cooperative.        Cognition and Memory: Cognition and memory normal.             Vital Signs: BP 112/75 (BP Location: Right Arm)   Pulse (!) 113   Temp (!) 96.1 F (35.6 C) (Axillary)   Resp 20   Ht '5\' 5"'  (1.651 m)   Wt 72.6 kg   SpO2 98%   BMI 26.63 kg/m  SpO2: SpO2: 98 % O2 Device: O2 Device: Nasal Cannula O2 Flow Rate: O2 Flow Rate (L/min): 2 L/min  Intake/output summary:   Intake/Output Summary (Last 24 hours) at 07/25/2020 1226 Last data filed at 07/25/2020 1200 Gross per 24 hour  Intake 4067.69 ml  Output 2130 ml  Net 1937.69 ml   LBM: Last BM Date: 07/23/20 Baseline Weight: Weight: 72.6 kg Most recent weight: Weight: 72.6 kg       Palliative Assessment/Data: PPS 30%    Flowsheet Rows   Flowsheet Row Most Recent Value  Intake Tab   Referral Department --  [EDP]   Unit at Time of Referral ER  Palliative Care Primary Diagnosis Sepsis/Infectious Disease  Date Notified 08/05/2020  Palliative Care Type New Palliative care  Reason for referral Clarify Goals of Care  Date of Admission 08/05/2020  Date first seen by Palliative Care 07/12/2020  # of days Palliative referral response time 0 Day(s)  # of days IP prior to Palliative referral 0  Clinical Assessment   Psychosocial & Spiritual Assessment   Palliative Care Outcomes   Patient/Family meeting held? No  [scheduled meeting for tomorrow as wife unavailable today]  Palliative Care Outcomes Clarified goals of care, Provided advance care planning, Provided psychosocial or spiritual support, Changed CPR status, Completed durable DNR  Patient/Family wishes: Interventions discontinued/not started  Mechanical Ventilation, BiPAP, NIPPV, Trach      Patient Active Problem List   Diagnosis Date Noted  . Sepsis ([place]) 08/05/2020  . AKI (acute kidney injury) ([place])   . Atrial fibrillation with rapid ventricular response ([place])   . Shock ([place])   . Renal insufficiency   . Metabolic acidosis     Palliative Care Assessment & Plan   Patient Profile: 75 y.o. male  with past medical history of atrial fibrillation on Xarelto, CAD, history of AAA s/p endovascular repair presented to the [facility] ED on 4/15 from home after an unwitnessed fall. Patient was [facility] two [facility] for an "infection," but further details are unclear. [facility] Patient was admitted to [facility] on 07/31/2020 with shock (unclear if septic, hemorrhagic, hypovolemic), normocytic anemia with elevated INR, renal insufficiency, atrial fibrillation with RVR, encephalopathy.   Assessment: Sepsis UTI Atrial fibrillation with RVR Anemia Acute kidney injury CAD Septic  encephalopathy Terminal care  Recommendations/Plan: Initiated full comfort measures -patient is clear he wants no other life-prolonging interventions including  antibiotics Continue DNR/DNI as previously documented Patient and family are agreeable for transfer to residential hospice, requesting [place] consult placed;TOC and [facility] liaison notified.  Per [place] will likely not have a bed until Monday or Tuesday Added orders for EOL symptom management and to reflect full comfort measures, as well as discontinued orders that were not focused on comfort Unrestricted visitation orders were placed per current [facility] EOL visitation policy  Nursing to provide frequent assessments and administer PRN medications as clinically necessary to ensure EOL comfort PMT will continue to follow and support holistically  Goals of Care and Additional Recommendations: Limitations on Scope of Treatment: Full Comfort Care  Code Status:    Code Status Orders  (From admission, onward)         Start     Ordered   07/30/2020 1024  Do not attempt resuscitation (DNR)  Continuous       Question Answer Comment  In the event of cardiac or respiratory ARREST Do not call a "code blue"   In the event of cardiac or respiratory ARREST Do not perform Intubation, CPR, defibrillation or ACLS   In the event of cardiac or respiratory ARREST Use medication by any route, position, wound care, and other measures to relive pain and suffering. May use oxygen, suction and manual treatment of airway obstruction as needed for comfort.      07/28/2020 1023        Code Status History    Date Active Date Inactive Code Status Order ID Comments User Context   08/04/2020 5015 07/29/2020 1023 Full Code [id number]  Gleason, [facility]   Advance Care Planning Activity    Advance Directive Documentation   Flowsheet Row Most Recent Value  Type of Advance Directive Living will  Pre-existing out of facility DNR order (yellow form or pink MOST form) --  "MOST" Form in Place? --      Prognosis:  < 2 weeks  Discharge Planning: Hospice facility  Care plan  was discussed with primary RN, Dr. Tacy Learn, patient, patient's family, TOC, hospice liaison  Thank you for allowing the Palliative Medicine Team to assist in the care of this patient.   Total Time  90 minutes Prolonged Time Billed  yes       Greater than 50%  of this time was spent counseling and coordinating care related to the above assessment and plan.  Lin Landsman, NP  Please contact Palliative Medicine Team phone at [phone] for questions and concerns.

## 2020-07-25 NOTE — Progress Notes (Signed)
eLink Physician-Brief Progress Note Patient Name: Lucas Garcia DOB: Dec 17, 1945 MRN: [id number]   Date of Service  07/25/2020  HPI/Events of Note  Patient's Afib with RVR persists despite Amiodarone infusion, rate 150's, BP 131/73, MAP 88.  eICU Interventions  IV PRN Metoprolol added to try to improve control since patient is on beta blocker Rx at home for this purpose. Levophed has been weaned off.        Thomasene Lot Amun Stemm 07/25/2020, 4:32 AM

## 2020-07-25 NOTE — Progress Notes (Signed)
Lucas Garcia is a 75 y.o. male patient admitted. Awake, alert - oriented  X 4 - no acute distress noted.  VSS - Blood pressure 135/90, pulse (!) 127, temperature 98 F (36.7 C), temperature source Oral, resp. rate (!) 22, height 5\' 5"  (1.651 m), weight 72.6 kg, SpO2 (!) 84 %.    IV in place, occlusive dsg intact without redness.  Orientation to room, and floor completed.  Admission INP armband ID verified with patient/family, and in place.   SR up x 2, fall assessment complete, with patient and family able to verbalize understanding of risk associated with falls, and verbalized understanding to call nsg before up out of bed.  Call light within reach, patient able to voice, and demonstrate understanding.      Will cont to eval and treat per MD orders.  , RN 07/25/2020 8:15 PM

## 2020-07-25 NOTE — Progress Notes (Signed)
Pt transferred to 6N17 without issue. Family at bedside. Belongings sent with patient, including home oxygen tank. Report given to receiving RN.

## 2020-07-26 DIAGNOSIS — G9341 Metabolic encephalopathy: Secondary | ICD-10-CM | POA: Diagnosis present

## 2020-07-26 DIAGNOSIS — R7881 Bacteremia: Secondary | ICD-10-CM | POA: Diagnosis present

## 2020-07-26 DIAGNOSIS — I2510 Atherosclerotic heart disease of native coronary artery without angina pectoris: Secondary | ICD-10-CM | POA: Diagnosis present

## 2020-07-26 DIAGNOSIS — Z951 Presence of aortocoronary bypass graft: Secondary | ICD-10-CM

## 2020-07-26 DIAGNOSIS — J439 Emphysema, unspecified: Secondary | ICD-10-CM | POA: Diagnosis present

## 2020-07-26 DIAGNOSIS — Z515 Encounter for palliative care: Secondary | ICD-10-CM

## 2020-07-26 DIAGNOSIS — I714 Abdominal aortic aneurysm, without rupture, unspecified: Secondary | ICD-10-CM | POA: Diagnosis present

## 2020-07-26 DIAGNOSIS — D62 Acute posthemorrhagic anemia: Secondary | ICD-10-CM | POA: Diagnosis present

## 2020-07-26 DIAGNOSIS — R319 Hematuria, unspecified: Secondary | ICD-10-CM | POA: Diagnosis present

## 2020-07-26 DIAGNOSIS — D649 Anemia, unspecified: Secondary | ICD-10-CM | POA: Diagnosis not present

## 2020-07-26 DIAGNOSIS — A419 Sepsis, unspecified organism: Secondary | ICD-10-CM | POA: Diagnosis not present

## 2020-07-26 DIAGNOSIS — B9620 Unspecified Escherichia coli [E. coli] as the cause of diseases classified elsewhere: Secondary | ICD-10-CM | POA: Diagnosis present

## 2020-07-26 DIAGNOSIS — N179 Acute kidney failure, unspecified: Secondary | ICD-10-CM | POA: Diagnosis not present

## 2020-07-26 DIAGNOSIS — I4891 Unspecified atrial fibrillation: Secondary | ICD-10-CM | POA: Diagnosis not present

## 2020-07-26 LAB — CULTURE, BLOOD (ROUTINE X 2)
Special Requests: ADEQUATE
Special Requests: ADEQUATE

## 2020-07-26 MED ORDER — MORPHINE 100MG IN NS 100ML (1MG/ML) PREMIX INFUSION
2.0000 mg/h | INTRAVENOUS | Status: DC
  Administered 2020-07-26: 2 mg/h via INTRAVENOUS
  Filled 2020-07-26: qty 100

## 2020-07-26 MED ORDER — MORPHINE BOLUS VIA INFUSION
1.0000 mg | INTRAVENOUS | Status: DC | PRN
Start: 2020-07-26 — End: 2020-07-27
  Administered 2020-07-26: 2 mg via INTRAVENOUS
  Filled 2020-07-26: qty 2

## 2020-08-09 NOTE — Death Summary Note (Signed)
DEATH SUMMARY   Patient Details  Name: Lucas Garcia MRN: [id number] DOB: Jun 21, 1945  Admission/Discharge Information   Admit Date:  2020-08-20  Date of Death:  2020/08/22  Time of Death:  17:40  Length of Stay: 2  Referring Physician: Pcp, No   Reason(s) for Hospitalization  Septic shock  Diagnoses  Preliminary cause of death: septic shock from E coli bacteremia Secondary Diagnoses (including complications and co-morbidities):  Active Problems:   Severe sepsis with septic shock (HCC)   AKI (acute kidney injury) (HCC)   Atrial fibrillation with rapid ventricular response (HCC)   E coli bacteremia   Acute blood loss anemia   Traumatic hematuria   Acute metabolic encephalopathy   Hx of CABG   CAD (coronary artery disease)   AAA (abdominal aortic aneurysm) (HCC)   Emphysema/COPD [facility])   Palliative care encounter   Brief Hospital Course (including significant findings, care, treatment, and services provided and events leading to death)  Lucas Garcia is a 75 y.o. year old male with a history of coronary artery disease status post CABG, abdominal aortic aneurysm status post repair, emphysema, atrial fibrillation on anticoagulation, and recently left the [facility] hospital AGAINST MEDICAL ADVICE where he had been admitted for last 2 weeks for an unclear infection.  He was brought to the hospital with altered mental status.  He was noted to be in septic shock he was admitted to the ICU.  Blood cultures positive for E. coli.  He was treated with IV fluids and antibiotics and briefly required vasopressors.  He was also noted to have significant hematuria.  He had apparently pulled out his Foley catheter at the [place] which resulted in frank hematuria.  He was transfused 2 units PRBC Foley catheter was placed.  Seen by urology with recommendations to continue Foley.  He was noted to have tachycardia which was rapid atrial fibrillation.  He was treated with amiodarone infusion.  Xarelto was held  in light of hematuria.  Due to his multiple comorbidities, he was seen by palliative care and goals of care were discussed.  Patient made it very clear regarding his wishes to pursue quality of life and comfort measures.  He did not wish to continue life-prolonging treatments.  Patient was transferred out of the ICU and continued under palliative care.  He received morphine for shortness of breath and pain.  Due to uncontrolled symptoms, he was started on a morphine infusion which significantly helped his dyspnea and overall pain.  On Aug 23, 2022, the patient passed from the hospital.  His family was at the bedside.  They were offered support.    Pertinent Labs and Studies  Significant Diagnostic Studies CT Head Wo Contrast  Result Date: 08-20-2020 CLINICAL DATA:  Unwitnessed fall, on throughout the EXAM: CT HEAD WITHOUT CONTRAST CT CERVICAL SPINE WITHOUT CONTRAST TECHNIQUE: Multidetector CT imaging of the head and cervical spine was performed following the standard protocol without intravenous contrast. Multiplanar CT image reconstructions of the cervical spine were also generated. COMPARISON:  None. FINDINGS: CT HEAD FINDINGS Brain: No evidence of acute infarction, hemorrhage, hydrocephalus, extra-axial collection, visible mass lesion or mass effect. Symmetric prominence of the ventricles, cisterns and sulci compatible with parenchymal volume loss. Patchy areas of white matter hypoattenuation are most compatible with chronic microvascular angiopathy. Vascular: Atherosclerotic calcification of the carotid siphons and intradural vertebral arteries. No hyperdense vessel. Gas within the cavernous sinuses may be related to intravenous access. Skull: No calvarial fracture or suspicious osseous lesion. No scalp swelling or  hematoma. Sinuses/Orbits: Paranasal sinuses and mastoid air cells are predominantly clear. Orbital structures are unremarkable aside from prior lens extractions. Other: Absence of the majority of the  dentition with only solitary remaining left maxillary cuspid and impacted right third mandibular molar. Dental prosthesis in place. CT CERVICAL SPINE FINDINGS Alignment: Slight exaggeration of the normal cervical lordosis. Mild rightward cranial rotation. No evidence of traumatic listhesis. No abnormally widened, perched or jumped facets. Normal alignment of the craniocervical and atlantoaxial articulations accounting for cranial positioning. Skull base and vertebrae: Motion degraded exam as well as photon starvation below the level of C5 may limit detection of subtle fractures or osseous abnormalities. Arthrosis, erosive changes and calcific pannus formation posterior to the dens, nonspecific but often seen with rheumatoid or CPPD arthropathy. No acute skull base fracture. No vertebral body fracture or height loss. Normal bone mineralization. No other worrisome osseous lesions. Additional cervical spondylitic changes as below. Soft tissues and spinal canal: No pre or paravertebral fluid or swelling. No visible canal hematoma. Airways patent. Disc levels: Multilevel intervertebral disc height loss with spondylitic endplate changes. Degenerative spurring most pronounced anteriorly. Disc osteophyte complex C3-4 result in some partial effacement of the ventral thecal sac without significant canal impingement. Additional uncinate spurring facet hypertrophic changes are present throughout the cervical levels resulting in mild-to-moderate foraminal narrowing C2-C4. Upper chest: Apical scarring and emphysematous changes. Calcifications of the aortic arch and proximal great vessels as well as the cervical carotids. Prior sternotomy. Some additional foci of gas noted in the right brachiocephalic vein. Other: Diminutive or absent thyroid gland. No concerning thyroid nodules or masses. IMPRESSION: 1. No evidence of acute intracranial abnormality. 2. Mild parenchymal volume loss and chronic microvascular ischemic white matter  disease. 3. Gas within the cavernous sinuses and right brachiocephalic vein may be related to intravenous access. 4. No evidence of acute fracture or traumatic listhesis of the cervical spine. 5. Motion degraded exam as well as photon starvation below the level of C5 may limit detection of subtle fractures or osseous abnormalities. 6. Multilevel degenerative changes of the cervical spine, as above. 7. Erosive changes with athero cysts and calcific pannus formation at the dens is overall nonspecific though can be seen with CPPD or rheumatoid arthropathy. 8. Absence of the majority of the dentition with only solitary remaining left maxillary cuspid and impacted right third mandibular molar. Correlate with dental exam. 9. Aortic Atherosclerosis (ICD10-I70.0). Electronically Signed   By: Kreg Shropshire M.D.   On: 08/01/2020 03:19   CT Cervical Spine Wo Contrast  Result Date: 07/10/2020 CLINICAL DATA:  Unwitnessed fall, on throughout the EXAM: CT HEAD WITHOUT CONTRAST CT CERVICAL SPINE WITHOUT CONTRAST TECHNIQUE: Multidetector CT imaging of the head and cervical spine was performed following the standard protocol without intravenous contrast. Multiplanar CT image reconstructions of the cervical spine were also generated. COMPARISON:  None. FINDINGS: CT HEAD FINDINGS Brain: No evidence of acute infarction, hemorrhage, hydrocephalus, extra-axial collection, visible mass lesion or mass effect. Symmetric prominence of the ventricles, cisterns and sulci compatible with parenchymal volume loss. Patchy areas of white matter hypoattenuation are most compatible with chronic microvascular angiopathy. Vascular: Atherosclerotic calcification of the carotid siphons and intradural vertebral arteries. No hyperdense vessel. Gas within the cavernous sinuses may be related to intravenous access. Skull: No calvarial fracture or suspicious osseous lesion. No scalp swelling or hematoma. Sinuses/Orbits: Paranasal sinuses and mastoid air  cells are predominantly clear. Orbital structures are unremarkable aside from prior lens extractions. Other: Absence of the majority of the dentition  with only solitary remaining left maxillary cuspid and impacted right third mandibular molar. Dental prosthesis in place. CT CERVICAL SPINE FINDINGS Alignment: Slight exaggeration of the normal cervical lordosis. Mild rightward cranial rotation. No evidence of traumatic listhesis. No abnormally widened, perched or jumped facets. Normal alignment of the craniocervical and atlantoaxial articulations accounting for cranial positioning. Skull base and vertebrae: Motion degraded exam as well as photon starvation below the level of C5 may limit detection of subtle fractures or osseous abnormalities. Arthrosis, erosive changes and calcific pannus formation posterior to the dens, nonspecific but often seen with rheumatoid or CPPD arthropathy. No acute skull base fracture. No vertebral body fracture or height loss. Normal bone mineralization. No other worrisome osseous lesions. Additional cervical spondylitic changes as below. Soft tissues and spinal canal: No pre or paravertebral fluid or swelling. No visible canal hematoma. Airways patent. Disc levels: Multilevel intervertebral disc height loss with spondylitic endplate changes. Degenerative spurring most pronounced anteriorly. Disc osteophyte complex C3-4 result in some partial effacement of the ventral thecal sac without significant canal impingement. Additional uncinate spurring facet hypertrophic changes are present throughout the cervical levels resulting in mild-to-moderate foraminal narrowing C2-C4. Upper chest: Apical scarring and emphysematous changes. Calcifications of the aortic arch and proximal great vessels as well as the cervical carotids. Prior sternotomy. Some additional foci of gas noted in the right brachiocephalic vein. Other: Diminutive or absent thyroid gland. No concerning thyroid nodules or masses.  IMPRESSION: 1. No evidence of acute intracranial abnormality. 2. Mild parenchymal volume loss and chronic microvascular ischemic white matter disease. 3. Gas within the cavernous sinuses and right brachiocephalic vein may be related to intravenous access. 4. No evidence of acute fracture or traumatic listhesis of the cervical spine. 5. Motion degraded exam as well as photon starvation below the level of C5 may limit detection of subtle fractures or osseous abnormalities. 6. Multilevel degenerative changes of the cervical spine, as above. 7. Erosive changes with athero cysts and calcific pannus formation at the dens is overall nonspecific though can be seen with CPPD or rheumatoid arthropathy. 8. Absence of the majority of the dentition with only solitary remaining left maxillary cuspid and impacted right third mandibular molar. Correlate with dental exam. 9. Aortic Atherosclerosis (ICD10-I70.0). Electronically Signed   By: Kreg Shropshire M.D.   On: 07/13/2020 03:19   US PELVIS LIMITED (TRANSABDOMINAL ONLY)  Result Date: 07/18/2020 CLINICAL DATA:  Hematuria. EXAM: LIMITED ULTRASOUND OF PELVIS TECHNIQUE: Limited transabdominal ultrasound examination of the pelvis was performed. COMPARISON:  None. FINDINGS: Limited sonographic evaluation of the urinary bladder was noted. Foley catheter appears to be well position within the bladder lumen. No bladder wall thickening is noted. No definite hematoma or other abnormality is noted within the lumen. IMPRESSION: Foley catheter is well positioned within bladder lumen. No other abnormality is noted involving urinary bladder. Electronically Signed   By: Lupita Raider M.D.   On: 07/11/2020 16:08   DG Chest Port 1 View  Result Date: 08/03/2020 CLINICAL DATA:  Fall EXAM: PORTABLE CHEST 1 VIEW COMPARISON:  05/07/2018 FINDINGS: The heart size and mediastinal contours are within normal limits. Both lungs are clear. The visualized skeletal structures are unremarkable. Remote  median sternotomy. IMPRESSION: No active disease. Electronically Signed   By: Deatra Robinson M.D.   On: 08/03/2020 02:33   ECHOCARDIOGRAM COMPLETE  Result Date: 08/04/2020    ECHOCARDIOGRAM REPORT   Patient Name:   Lucas Garcia Date of Exam: 07/13/2020 Medical Rec #:  [id number]  Height:       65.0 in Accession #:    [id number]       Weight:       160.0 lb Date of Birth:  Jun 26, 1945        BSA:          1.799 m Patient Age:    74 years         BP:           101/71 mmHg Patient Gender: M                HR:           125 bpm. Exam Location:  Inpatient Procedure: 2D Echo, Cardiac Doppler, Color Doppler and Intracardiac            Opacification Agent Indications:    Atrial fibrillation  History:        Patient has no prior history of Echocardiogram examinations.                 CAD; Arrythmias:Atrial Fibrillation. AAA s/p reapir. CKD.  Sonographer:    Ross Ludwig RDCS (AE) Referring Phys: [id number] Aliene Beams  Sonographer Comments: Technically difficult study due to poor echo windows. IMPRESSIONS  1. Left ventricular ejection fraction, by estimation, is 55 to 60%. The left ventricle has normal function. The left ventricle has no regional wall motion abnormalities. There is moderate concentric left ventricular hypertrophy. Left ventricular diastolic function could not be evaluated.  2. Right ventricular systolic function was not well visualized. The right ventricular size is normal.  3. Left atrial size was severely dilated.  4. Right atrial size was moderately dilated.  5. The mitral valve is grossly normal. Trivial mitral valve regurgitation. No evidence of mitral stenosis.  6. The aortic valve is grossly normal. There is mild calcification of the aortic valve. Aortic valve regurgitation is not visualized. Mild aortic valve sclerosis is present, with no evidence of aortic valve stenosis.  7. Aortic dilatation noted. There is borderline dilatation of the ascending aorta, measuring 38 mm.  8. The inferior  vena cava is dilated in size with >50% respiratory variability, suggesting right atrial pressure of 8 mmHg. Comparison(s): No prior Echocardiogram. Conclusion(s)/Recommendation(s): Grossly normal LVEF without focal wall motion abnormalities. Technically challenging study, even with use of echo contrast. FINDINGS  Left Ventricle: Left ventricular ejection fraction, by estimation, is 55 to 60%. The left ventricle has normal function. The left ventricle has no regional wall motion abnormalities. Definity contrast agent was given IV to delineate the left ventricular  endocardial borders. The left ventricular internal cavity size was normal in size. There is moderate concentric left ventricular hypertrophy. Left ventricular diastolic function could not be evaluated due to atrial fibrillation. Left ventricular diastolic function could not be evaluated. Right Ventricle: The right ventricular size is normal. Right vetricular wall thickness was not well visualized. Right ventricular systolic function was not well visualized. Left Atrium: Left atrial size was severely dilated. Right Atrium: Right atrial size was moderately dilated. Pericardium: Trivial pericardial effusion is present. Presence of pericardial fat pad. Mitral Valve: The mitral valve is grossly normal. Trivial mitral valve regurgitation. No evidence of mitral valve stenosis. Tricuspid Valve: The tricuspid valve is grossly normal. Tricuspid valve regurgitation is trivial. Aortic Valve: The aortic valve is grossly normal. There is mild calcification of the aortic valve. Aortic valve regurgitation is not visualized. Mild aortic valve sclerosis is present, with no evidence of aortic valve stenosis. Aortic valve mean gradient  measures 2.3 mmHg. Aortic valve peak gradient measures 3.6 mmHg. Aortic valve area, by VTI measures 2.92 cm. Pulmonic Valve: The pulmonic valve was not well visualized. Pulmonic valve regurgitation is not visualized. Aorta: Aortic dilatation  noted. There is borderline dilatation of the ascending aorta, measuring 38 mm. Venous: The inferior vena cava is dilated in size with greater than 50% respiratory variability, suggesting right atrial pressure of 8 mmHg. IAS/Shunts: The atrial septum is grossly normal.  LEFT VENTRICLE PLAX 2D LVIDd:         4.00 cm LVIDs:         3.00 cm LV PW:         1.60 cm LV IVS:        1.30 cm LVOT diam:     2.30 cm LV SV:         37 LV SV Index:   21 LVOT Area:     4.15 cm  RIGHT VENTRICLE          IVC RV Basal diam:  3.10 cm  IVC diam: 2.10 cm TAPSE (M-mode): 0.9 cm LEFT ATRIUM             Index       RIGHT ATRIUM           Index LA diam:        4.30 cm 2.39 cm/m  RA Area:     20.00 cm LA Vol (A2C):   62.4 ml 34.68 ml/m RA Volume:   53.50 ml  29.74 ml/m LA Vol (A4C):   95.9 ml 53.30 ml/m LA Biplane Vol: 81.0 ml 45.02 ml/m  AORTIC VALVE AV Area (Vmax):    3.14 cm AV Area (Vmean):   2.84 cm AV Area (VTI):     2.92 cm AV Vmax:           94.83 cm/s AV Vmean:          70.033 cm/s AV VTI:            0.128 m AV Peak Grad:      3.6 mmHg AV Mean Grad:      2.3 mmHg LVOT Vmax:         71.78 cm/s LVOT Vmean:        47.825 cm/s LVOT VTI:          0.090 m LVOT/AV VTI ratio: 0.70  AORTA Ao Root diam: 4.30 cm Ao Asc diam:  3.80 cm  SHUNTS Systemic VTI:  0.09 m Systemic Diam: 2.30 cm Jodelle Red MD Electronically signed by Jodelle Red MD Signature Date/Time: 07/16/2020/7:50:46 PM    Final    CT Angio Abd/Pel W and/or Wo Contrast  Result Date: 07/21/2020 CLINICAL DATA:  75 year old male with history of AAA treated with bifurcated abdominal aortic endograft. Found down. Unwitnessed fall. Low back pain. Recently hospitalized for "infection". EXAM: CTA ABDOMEN AND PELVIS WITHOUT AND WITH CONTRAST TECHNIQUE: Multidetector CT imaging of the abdomen and pelvis was performed using the standard protocol during bolus administration of intravenous contrast. Multiplanar reconstructed images and MIPs were obtained and  reviewed to evaluate the vascular anatomy. CONTRAST:  75mL OMNIPAQUE IOHEXOL 350 MG/ML SOLN COMPARISON:  CTA abdomen and pelvis 06/12/2018. FINDINGS: VASCULAR Early arterial phase imaging only on this exam. Tortuous descending thoracic aorta with soft and calcified plaque. Infrarenal abdominal aortic aneurysm with chronic bifurcated endograft. The endograft appears patent although the imaging appears to out run the bolus at the level of the native iliac arteries. The native AAA  aneurysm sac now measures up to 53-56 mm diameter, which is decreased from 58-60 mm in 2020. Stable configuration of the endograft and iliac limbs compared to 2020. No in stent stenosis suspected. But insufficient contrast in the iliac and femoral arteries to evaluate patency. Calcified iliofemoral atherosclerosis again noted. She Review of the MIP images confirms the above findings. NON-VASCULAR Lower chest: Stable mild cardiomegaly since 2020. No pericardial effusion. Tortuous descending thoracic aorta with atherosclerosis. Evidence of prior CABG. Some centrilobular emphysema identified in both lungs. There is peripheral left lower lobe mild pulmonary ground-glass and peribronchial nodularity (series 6, image 47), new since 2020. Platelike atelectasis in the right lower lobe is new in addition to more subtle tree-in-bud nodular opacities there. Improved chronic scarring and atelectasis in the right middle lobe, stable and the lingula. Hepatobiliary: There is some reflux of contrast from the right ventricle into the hepatic IVC and hepatic veins. Otherwise negative liver and gallbladder. Pancreas: Negative. Spleen: Negative. Adrenals/Urinary Tract: Normal adrenal glands. Stable, nonobstructed kidneys. Chronic left renal lower pole cyst. Bilateral renal vascular calcifications. Symmetric early arterial phase renal enhancement. Ureters appear stable, nondilated. Urinary bladder remarkable for a small chronic right lateral bladder diverticulum,  but also non dependent gas within the bladder (series 5, image 192). No definite perivesical stranding. Stomach/Bowel: Gas in the rectum which is otherwise negative. Diverticulosis throughout the sigmoid colon. No active inflammation identified. Similar diverticulosis in the descending colon which is decompressed. Gas-filled but otherwise negative transverse colon. Redundant right colon with retained stool and also diverticulosis. Normal appendix on series 5, image 169. The cecum is on a lax mesentery. No large bowel inflammation identified. Decompressed terminal ileum. No dilated small bowel. Stomach and duodenum are within normal limits. No free air, free fluid, mesenteric inflammation. Lymphatic: No lymphadenopathy. Reproductive: Negative. Other: No pelvic free fluid. Musculoskeletal: Prior sternotomy. No lower rib fracture identified. Visible spine appears stable since 2020. Sacrum, SI joints, pelvis and proximal femurs appear stable and intact. IMPRESSION: 1. Early arterial phase imaging only, and possible degree of underlying right heart failure (reflux of right heart contrast into the hepatic IVC), such that there is no arterial contrast in the pelvis. 2. But stable and satisfactory Endograft repair of chronic AAA, with decreased size of the native aneurysm sac since 2020 (now 53-56 mm diameter). Superimposed Aortic Atherosclerosis (ICD10-I70.0), calcified iliofemoral atherosclerosis. 3. Emphysema (ICD10-J43.9) with suspected Acute Infectious Exacerbation in both lower lobes in the form of mild peribronchial and tree-in-bud pulmonary opacity. No pleural effusion or consolidation. 4. No acute or inflammatory process identified in the abdomen or pelvis. Extensive large bowel diverticulosis with no active inflammation identified. Normal appendix. 5. Cardiomegaly.  Prior CABG. Electronically Signed   By: Odessa FlemingH  Hall M.D.   On: 08/01/2020 05:03    Microbiology Recent Results (from the past 240 hour(s))  Resp  Panel by RT-PCR (Flu A&B, Covid) Nasopharyngeal Swab     Status: None   Collection Time: 07/25/2020  1:46 AM   Specimen: Nasopharyngeal Swab; Nasopharyngeal(NP) swabs in vial transport medium  Result Value Ref Range Status   SARS Coronavirus 2 by RT PCR NEGATIVE NEGATIVE Final    Comment: (NOTE) SARS-CoV-2 target nucleic acids are NOT DETECTED.  The SARS-CoV-2 RNA is generally detectable in upper respiratory specimens during the acute phase of infection. The lowest concentration of SARS-CoV-2 viral copies this assay can detect is 138 copies/mL. A negative result does not preclude SARS-Cov-2 infection and should not be used as the sole basis for treatment or  other patient management decisions. A negative result may occur with  improper specimen collection/handling, submission of specimen other than nasopharyngeal swab, presence of viral mutation(s) within the areas targeted by this assay, and inadequate number of viral copies(<138 copies/mL). A negative result must be combined with clinical observations, patient history, and epidemiological information. The expected result is Negative.  Fact Sheet for Patients:  BloggerCourse.com  Fact Sheet for Healthcare Providers:  SeriousBroker.it  This test is no t yet approved or cleared by the [place] FDA and  has been authorized for detection and/or diagnosis of SARS-CoV-2 by FDA under an Emergency Use Authorization (EUA). This EUA will remain  in effect (meaning this test can be used) for the duration of the COVID-19 declaration under Section 564(b)(1) of the Act, 21 U.S.C.section 360bbb-3(b)(1), unless the authorization is terminated  or revoked sooner.       Influenza A by PCR NEGATIVE NEGATIVE Final   Influenza B by PCR NEGATIVE NEGATIVE Final    Comment: (NOTE) The Xpert Xpress SARS-CoV-2/FLU/RSV plus assay is intended as an aid in the diagnosis of influenza from Nasopharyngeal  swab specimens and should not be used as a sole basis for treatment. Nasal washings and aspirates are unacceptable for Xpert Xpress SARS-CoV-2/FLU/RSV testing.  Fact Sheet for Patients: BloggerCourse.com  Fact Sheet for Healthcare Providers: SeriousBroker.it  This test is not yet approved or cleared by the [place] FDA and has been authorized for detection and/or diagnosis of SARS-CoV-2 by FDA under an Emergency Use Authorization (EUA). This EUA will remain in effect (meaning this test can be used) for the duration of the COVID-19 declaration under Section 564(b)(1) of the Act, 21 U.S.C. section 360bbb-3(b)(1), unless the authorization is terminated or revoked.  Performed at [facility] [facility], [address] [place], [place] [address]   Blood Culture (routine x 2)     Status: Abnormal   Collection Time: 07/10/2020  2:20 AM   Specimen: BLOOD LEFT HAND  Result Value Ref Range Status   Specimen Description BLOOD LEFT HAND  Final   Special Requests   Final    BOTTLES DRAWN AEROBIC AND ANAEROBIC Blood Culture adequate volume   Culture  Setup Time   Final    GRAM NEGATIVE RODS IN BOTH AEROBIC AND ANAEROBIC BOTTLES CRITICAL VALUE NOTED.  VALUE IS CONSISTENT WITH PREVIOUSLY REPORTED AND CALLED VALUE.    Culture (A)  Final    ESCHERICHIA COLI SUSCEPTIBILITIES PERFORMED ON PREVIOUS CULTURE WITHIN THE LAST 5 DAYS. Performed at [facility], [address] [place], [place] [address]    Report Status Aug 17, 2020 FINAL  Final  Blood Culture (routine x 2)     Status: Abnormal   Collection Time: 07/30/2020  2:22 AM   Specimen: BLOOD  Result Value Ref Range Status   Specimen Description BLOOD LEFT ANTECUBITAL  Final   Special Requests   Final    BOTTLES DRAWN AEROBIC AND ANAEROBIC Blood Culture adequate volume   Culture  Setup Time   Final    GRAM NEGATIVE RODS IN BOTH AEROBIC AND ANAEROBIC BOTTLES CRITICAL RESULT CALLED TO,  READ BACK BY AND VERIFIED WITH: GRACE BARR PHARMD @1716  08/02/2020 EB Performed at [facility], [address], [place], [place]    Culture ESCHERICHIA COLI (A)  Final   Report Status 2020/08/17 FINAL  Final   Organism ID, Bacteria ESCHERICHIA COLI  Final      Susceptibility   Escherichia coli - MIC*    AMPICILLIN <=  2 SENSITIVE Sensitive     CEFAZOLIN <=4 SENSITIVE Sensitive     CEFEPIME <=0.12 SENSITIVE Sensitive     CEFTAZIDIME <=1 SENSITIVE Sensitive     CEFTRIAXONE <=0.25 SENSITIVE Sensitive     CIPROFLOXACIN <=0.25 SENSITIVE Sensitive     GENTAMICIN <=1 SENSITIVE Sensitive     IMIPENEM <=0.25 SENSITIVE Sensitive     TRIMETH/SULFA <=20 SENSITIVE Sensitive     AMPICILLIN/SULBACTAM <=2 SENSITIVE Sensitive     PIP/TAZO <=4 SENSITIVE Sensitive     * ESCHERICHIA COLI  Blood Culture ID Panel (Reflexed)     Status: Abnormal   Collection Time: 08/08/2020  2:22 AM  Result Value Ref Range Status   Enterococcus faecalis NOT DETECTED NOT DETECTED Final   Enterococcus Faecium NOT DETECTED NOT DETECTED Final   Listeria monocytogenes NOT DETECTED NOT DETECTED Final   Staphylococcus species NOT DETECTED NOT DETECTED Final   Staphylococcus aureus (BCID) NOT DETECTED NOT DETECTED Final   Staphylococcus epidermidis NOT DETECTED NOT DETECTED Final   Staphylococcus lugdunensis NOT DETECTED NOT DETECTED Final   Streptococcus species NOT DETECTED NOT DETECTED Final   Streptococcus agalactiae NOT DETECTED NOT DETECTED Final   Streptococcus pneumoniae NOT DETECTED NOT DETECTED Final   Streptococcus pyogenes NOT DETECTED NOT DETECTED Final   A.calcoaceticus-baumannii NOT DETECTED NOT DETECTED Final   Bacteroides fragilis NOT DETECTED NOT DETECTED Final   Enterobacterales DETECTED (A) NOT DETECTED Final    Comment: Enterobacterales represent a large order of gram negative bacteria, not a single organism. CRITICAL RESULT CALLED TO, READ BACK BY AND VERIFIED WITH: GRACE BARR PHARMD   07/23/2020 EB    Enterobacter cloacae complex NOT DETECTED NOT DETECTED Final   Escherichia coli DETECTED (A) NOT DETECTED Final    Comment: CRITICAL RESULT CALLED TO, READ BACK BY AND VERIFIED WITH: GRACE BARR PHARMD  07/25/2020 EB    Klebsiella aerogenes NOT DETECTED NOT DETECTED Final   Klebsiella oxytoca NOT DETECTED NOT DETECTED Final   Klebsiella pneumoniae NOT DETECTED NOT DETECTED Final   Proteus species NOT DETECTED NOT DETECTED Final   Salmonella species NOT DETECTED NOT DETECTED Final   Serratia marcescens NOT DETECTED NOT DETECTED Final   Haemophilus influenzae NOT DETECTED NOT DETECTED Final   Neisseria meningitidis NOT DETECTED NOT DETECTED Final   Pseudomonas aeruginosa NOT DETECTED NOT DETECTED Final   Stenotrophomonas maltophilia NOT DETECTED NOT DETECTED Final   Candida albicans NOT DETECTED NOT DETECTED Final   Candida auris NOT DETECTED NOT DETECTED Final   Candida glabrata NOT DETECTED NOT DETECTED Final   Candida krusei NOT DETECTED NOT DETECTED Final   Candida parapsilosis NOT DETECTED NOT DETECTED Final   Candida tropicalis NOT DETECTED NOT DETECTED Final   Cryptococcus neoformans/gattii NOT DETECTED NOT DETECTED Final   CTX-M ESBL NOT DETECTED NOT DETECTED Final   Carbapenem resistance IMP NOT DETECTED NOT DETECTED Final   Carbapenem resistance KPC NOT DETECTED NOT DETECTED Final   Carbapenem resistance NDM NOT DETECTED NOT DETECTED Final   Carbapenem resist OXA 48 LIKE NOT DETECTED NOT DETECTED Final   Carbapenem resistance VIM NOT DETECTED NOT DETECTED Final    Comment: Performed at [facility], [address] [place], [place] [address]    Lab Basic Metabolic Panel: Recent Labs  Lab 07/14/2020 0146 07/15/2020 0449 08/08/2020 0509 07/25/2020 0517 07/30/2020 0646 07/14/2020 1443 07/25/20 0909  NA 130* 132*  --  TEST REQUEST RECEIVED WITHOUT APPROPRIATE SPECIMEN 132* 133* 136  K 4.4 4.4  --   --  4.1 4.1  --  CL 92* 91*  --   --   --  96*  --   CO2  21* 26  --   --   --  27  --   GLUCOSE 290* 187*  --   --   --  169*  --   BUN 38* 39*  --   --   --  33*  --   CREATININE 1.72* 1.75*  --  TEST REQUEST RECEIVED WITHOUT APPROPRIATE SPECIMEN  --  1.24 1.01  CALCIUM 8.3* 8.4*  --   --   --  8.1*  --   MG  --  1.6* 1.6*  --   --   --   --   PHOS  --  4.6  --   --   --   --   --    Liver Function Tests: Recent Labs  Lab 07/27/2020 0146  AST 22  ALT 15  ALKPHOS 97  BILITOT 1.9*  PROT 4.6*  ALBUMIN 2.0*   No results for input(s): LIPASE, AMYLASE in the last 168 hours. No results for input(s): AMMONIA in the last 168 hours. CBC: Recent Labs  Lab 07/15/2020 0146 07/14/2020 0449 07/19/2020 0646 08/08/2020 2038  WBC 5.8 14.8*  --   --   NEUTROABS 5.5  --   --   --   HGB 6.9* 7.5* 6.8* 8.7*  HCT 24.5* 26.1* 20.0* 28.0*  MCV 80.9 79.6*  --   --   PLT 288 321  --   --    Cardiac Enzymes: No results for input(s): CKTOTAL, CKMB, CKMBINDEX, TROPONINI in the last 168 hours. Sepsis Labs: Recent Labs  Lab 08/08/2020 0146 07/19/2020 0241 07/15/2020 0420 07/18/2020 0449 07/13/2020 1443  WBC 5.8  --   --  14.8*  --   LATICACIDVEN  --  >11.0* >11.0*  --  1.9    Procedures/Operations     Macaela Presas Aug 03, 2020, 6:18 PM

## 2020-08-09 NOTE — Progress Notes (Signed)
Nutrition Brief Note  Chart reviewed. Pt now transitioning to comfort care.  No further nutrition interventions planned at this time.  Please re-consult as needed.   Niccolas Loeper, MS, RD, LDN RD pager number and weekend/on-call pager number located in [phone].    

## 2020-08-09 NOTE — Progress Notes (Signed)
PROGRESS NOTE    KHYRON GARNO  ZOX:[id number] DOB: [birth year] DOA: 07/15/2020 PCP: Pcp, No    Brief Narrative:  75 y/o male admitted to the hospital with septic shock from E coli bacteremia. He was noted to have acute blood loss anemia from gross hematuria when he has pulled out his catheter. He has A fib and is chronically on xarelto. Seen by palliative care and after discussions with patient and family, decision made to transition to comfort measures   Assessment & Plan:   Active Problems:   Sepsis (HCC)   Septic shock due to E coli bacteremia, likely from urinary source -patient was treated with ceftriaxone -He briefly required levophed infusion, now off  Acute blood loss anemia due to urethral bleeding (traumatic hematuria) -patient had pulled out foley catheter at the [facility] which had resulted in frank hematuria -he received 2 unit prbc -foley catheter placed and urine has since cleared -seen by urology with recommendations to continue foley  Paroxysmal A fib with RVR -related to sepsis  -briefly required amiodarone infusion -he was anticoagulated with xarelto, but this was held in the setting of bleeding  Acute kidney injury -on presentation, his creatinine was 1.7 -this improved to 1.2 with IV fluids  Sepsis encephalopathy -he remains confused -currently pain medications may also be contributing to encephalopathy  Goals of care -seen by palliative care to address goals of care -after discussions with family and patient, the patient had his wishes very clear that he did not want to continue any life prolonging treatments and wanted to focus solely on quality of life and comfort measures -he is currently on intermittent morphine for pain/dyspnea -Since he has required frequent dosing and still appears uncomfortable, I have recommended a morphine infusion. Family is agreeable -Once symptoms are better controlled, if he maintains a degree of stability, can consider  transfer to residential hospice    DVT prophylaxis: none. Comfort meausres  Code Status: DNR, comfort measures Family Communication: discussed with wife and children at the bedside Disposition Plan: Status is: Inpatient  Remains inpatient appropriate because:Ongoing active pain requiring inpatient pain management, IV treatments appropriate due to intensity of illness or inability to take PO and Inpatient level of care appropriate due to severity of illness   Dispo: The patient is from: Home              Anticipated d/c is to: Residential hospice              Patient currently is not medically stable to d/c.   Difficult to place patient No    Consultants:   PCCM  Palliative care  urology  Procedures:     Antimicrobials:       Subjective: Says he is in pain. Has rapid breathing. Not able to engage in further conversation  Objective: Vitals:   07/25/20 1500 07/25/20 1514 07/25/20 1600 07/25/20 2011  BP: 115/84  91/67 135/90  Pulse: (!) 105  (!) 110 (!) 127  Resp: (!) 24  (!) 24 (!) 22  Temp:  97.8 F (36.6 C)  98 F (36.7 C)  TempSrc:  Oral  Oral  SpO2: 99%  (!) 88% (!) 84%  Weight:      Height:        Intake/Output Summary (Last 24 hours) at 2020-08-24 1054 Last data filed at 08/24/2020 0201 Gross per 24 hour  Intake 1069.46 ml  Output 575 ml  Net 494.46 ml   Filed Weights   07/25/2020 0156  Weight: 72.6 kg    Examination:  General exam: Appears uncomfortable with rapid breathing Respiratory system: Clear to auscultation. Increased respiratory effort Cardiovascular system: S1 & S2 heard, RRR. No JVD, murmurs, rubs, gallops or clicks. No pedal edema.   Data Reviewed: I have personally reviewed following labs and imaging studies  CBC: Recent Labs  Lab 07/25/2020 0146 08/08/2020 0449 07/19/2020 0646 07/29/2020 2038  WBC 5.8 14.8*  --   --   NEUTROABS 5.5  --   --   --   HGB 6.9* 7.5* 6.8* 8.7*  HCT 24.5* 26.1* 20.0* 28.0*  MCV 80.9 79.6*  --   --    PLT 288 321  --   --    Basic Metabolic Panel: Recent Labs  Lab 08/06/2020 0146 07/18/2020 0449 07/10/2020 0509 08/07/2020 0517 07/21/2020 0646 07/25/2020 1443 07/25/20 0909  NA 130* 132*  --  TEST REQUEST RECEIVED WITHOUT APPROPRIATE SPECIMEN 132* 133* 136  K 4.4 4.4  --   --  4.1 4.1  --   CL 92* 91*  --   --   --  96*  --   CO2 21* 26  --   --   --  27  --   GLUCOSE 290* 187*  --   --   --  169*  --   BUN 38* 39*  --   --   --  33*  --   CREATININE 1.72* 1.75*  --  TEST REQUEST RECEIVED WITHOUT APPROPRIATE SPECIMEN  --  1.24 1.01  CALCIUM 8.3* 8.4*  --   --   --  8.1*  --   MG  --  1.6* 1.6*  --   --   --   --   PHOS  --  4.6  --   --   --   --   --    GFR: Estimated Creatinine Clearance: 55.8 mL/min (by C-G formula based on SCr of 1.01 mg/dL). Liver Function Tests: Recent Labs  Lab 08/07/2020 0146  AST 22  ALT 15  ALKPHOS 97  BILITOT 1.9*  PROT 4.6*  ALBUMIN 2.0*   No results for input(s): LIPASE, AMYLASE in the last 168 hours. No results for input(s): AMMONIA in the last 168 hours. Coagulation Profile: Recent Labs  Lab 07/17/2020 0146 07/25/20 0944  INR 6.1* 1.2   Cardiac Enzymes: No results for input(s): CKTOTAL, CKMB, CKMBINDEX, TROPONINI in the last 168 hours. BNP (last 3 results) No results for input(s): PROBNP in the last 8760 hours. HbA1C: Recent Labs    07/25/2020 0520  HGBA1C 7.0*   CBG: Recent Labs  Lab 07/25/20 0007 07/25/20 0402 07/25/20 0726 07/25/20 1121 07/25/20 1510  GLUCAP 121* 116* 113* 109* 95   Lipid Profile: No results for input(s): CHOL, HDL, LDLCALC, TRIG, CHOLHDL, LDLDIRECT in the last 72 hours. Thyroid Function Tests: Recent Labs    08/08/2020 1443  TSH 3.013   Anemia Panel: Recent Labs    08/02/2020 1443  FERRITIN 166  TIBC 251  IRON 35*  RETICCTPCT 3.4*   Sepsis Labs: Recent Labs  Lab 07/14/2020 0241 07/16/2020 0420 08/08/2020 1443  LATICACIDVEN >11.0* >11.0* 1.9    Recent Results (from the past 240 hour(s))  Resp Panel  by RT-PCR (Flu A&B, Covid) Nasopharyngeal Swab     Status: None   Collection Time: 07/30/2020  1:46 AM   Specimen: Nasopharyngeal Swab; Nasopharyngeal(NP) swabs in vial transport medium  Result Value Ref Range Status   SARS Coronavirus 2 by RT PCR  NEGATIVE NEGATIVE Final    Comment: (NOTE) SARS-CoV-2 target nucleic acids are NOT DETECTED.  The SARS-CoV-2 RNA is generally detectable in upper respiratory specimens during the acute phase of infection. The lowest concentration of SARS-CoV-2 viral copies this assay can detect is 138 copies/mL. A negative result does not preclude SARS-Cov-2 infection and should not be used as the sole basis for treatment or other patient management decisions. A negative result may occur with  improper specimen collection/handling, submission of specimen other than nasopharyngeal swab, presence of viral mutation(s) within the areas targeted by this assay, and inadequate number of viral copies(<138 copies/mL). A negative result must be combined with clinical observations, patient history, and epidemiological information. The expected result is Negative.  Fact Sheet for Patients:  BloggerCourse.com  Fact Sheet for Healthcare Providers:  SeriousBroker.it  This test is no t yet approved or cleared by the [place] FDA and  has been authorized for detection and/or diagnosis of SARS-CoV-2 by FDA under an Emergency Use Authorization (EUA). This EUA will remain  in effect (meaning this test can be used) for the duration of the COVID-19 declaration under Section 564(b)(1) of the Act, 21 U.S.C.section 360bbb-3(b)(1), unless the authorization is terminated  or revoked sooner.       Influenza A by PCR NEGATIVE NEGATIVE Final   Influenza B by PCR NEGATIVE NEGATIVE Final    Comment: (NOTE) The Xpert Xpress SARS-CoV-2/FLU/RSV plus assay is intended as an aid in the diagnosis of influenza from Nasopharyngeal swab  specimens and should not be used as a sole basis for treatment. Nasal washings and aspirates are unacceptable for Xpert Xpress SARS-CoV-2/FLU/RSV testing.  Fact Sheet for Patients: BloggerCourse.com  Fact Sheet for Healthcare Providers: SeriousBroker.it  This test is not yet approved or cleared by the [place] FDA and has been authorized for detection and/or diagnosis of SARS-CoV-2 by FDA under an Emergency Use Authorization (EUA). This EUA will remain in effect (meaning this test can be used) for the duration of the COVID-19 declaration under Section 564(b)(1) of the Act, 21 U.S.C. section 360bbb-3(b)(1), unless the authorization is terminated or revoked.  Performed at [facility], [address] [place], [place] [address]   Blood Culture (routine x 2)     Status: Abnormal   Collection Time: 07/25/2020  2:20 AM   Specimen: BLOOD LEFT HAND  Result Value Ref Range Status   Specimen Description BLOOD LEFT HAND  Final   Special Requests   Final    BOTTLES DRAWN AEROBIC AND ANAEROBIC Blood Culture adequate volume   Culture  Setup Time   Final    GRAM NEGATIVE RODS IN BOTH AEROBIC AND ANAEROBIC BOTTLES CRITICAL VALUE NOTED.  VALUE IS CONSISTENT WITH PREVIOUSLY REPORTED AND CALLED VALUE.    Culture (A)  Final    ESCHERICHIA COLI SUSCEPTIBILITIES PERFORMED ON PREVIOUS CULTURE WITHIN THE LAST 5 DAYS. Performed at [facility], [address] [place], [place] [address]    Report Status 07-27-20 FINAL  Final  Blood Culture (routine x 2)     Status: Abnormal   Collection Time: 08/06/2020  2:22 AM   Specimen: BLOOD  Result Value Ref Range Status   Specimen Description BLOOD LEFT ANTECUBITAL  Final   Special Requests   Final    BOTTLES DRAWN AEROBIC AND ANAEROBIC Blood Culture adequate volume   Culture  Setup Time   Final    GRAM NEGATIVE RODS IN BOTH AEROBIC AND ANAEROBIC BOTTLES CRITICAL RESULT CALLED TO, READ  BACK BY AND VERIFIED WITH: GRACE BARR PHARMD @1716  07/30/2020 EB Performed at [facility], [address], [place], [place]    Culture ESCHERICHIA COLI (A)  Final   Report Status 2020-08-08 FINAL  Final   Organism ID, Bacteria ESCHERICHIA COLI  Final      Susceptibility   Escherichia coli - MIC*    AMPICILLIN <=2 SENSITIVE Sensitive     CEFAZOLIN <=4 SENSITIVE Sensitive     CEFEPIME <=0.12 SENSITIVE Sensitive     CEFTAZIDIME <=1 SENSITIVE Sensitive     CEFTRIAXONE <=0.25 SENSITIVE Sensitive     CIPROFLOXACIN <=0.25 SENSITIVE Sensitive     GENTAMICIN <=1 SENSITIVE Sensitive     IMIPENEM <=0.25 SENSITIVE Sensitive     TRIMETH/SULFA <=20 SENSITIVE Sensitive     AMPICILLIN/SULBACTAM <=2 SENSITIVE Sensitive     PIP/TAZO <=4 SENSITIVE Sensitive     * ESCHERICHIA COLI  Blood Culture ID Panel (Reflexed)     Status: Abnormal   Collection Time: 07/29/2020  2:22 AM  Result Value Ref Range Status   Enterococcus faecalis NOT DETECTED NOT DETECTED Final   Enterococcus Faecium NOT DETECTED NOT DETECTED Final   Listeria monocytogenes NOT DETECTED NOT DETECTED Final   Staphylococcus species NOT DETECTED NOT DETECTED Final   Staphylococcus aureus (BCID) NOT DETECTED NOT DETECTED Final   Staphylococcus epidermidis NOT DETECTED NOT DETECTED Final   Staphylococcus lugdunensis NOT DETECTED NOT DETECTED Final   Streptococcus species NOT DETECTED NOT DETECTED Final   Streptococcus agalactiae NOT DETECTED NOT DETECTED Final   Streptococcus pneumoniae NOT DETECTED NOT DETECTED Final   Streptococcus pyogenes NOT DETECTED NOT DETECTED Final   A.calcoaceticus-baumannii NOT DETECTED NOT DETECTED Final   Bacteroides fragilis NOT DETECTED NOT DETECTED Final   Enterobacterales DETECTED (A) NOT DETECTED Final    Comment: Enterobacterales represent a large order of gram negative bacteria, not a single organism. CRITICAL RESULT CALLED TO, READ BACK BY AND VERIFIED WITH: GRACE BARR PHARMD @1716   07/15/2020 EB    Enterobacter cloacae complex NOT DETECTED NOT DETECTED Final   Escherichia coli DETECTED (A) NOT DETECTED Final    Comment: CRITICAL RESULT CALLED TO, READ BACK BY AND VERIFIED WITH: GRACE BARR PHARMD @1716  08/02/2020 EB    Klebsiella aerogenes NOT DETECTED NOT DETECTED Final   Klebsiella oxytoca NOT DETECTED NOT DETECTED Final   Klebsiella pneumoniae NOT DETECTED NOT DETECTED Final   Proteus species NOT DETECTED NOT DETECTED Final   Salmonella species NOT DETECTED NOT DETECTED Final   Serratia marcescens NOT DETECTED NOT DETECTED Final   Haemophilus influenzae NOT DETECTED NOT DETECTED Final   Neisseria meningitidis NOT DETECTED NOT DETECTED Final   Pseudomonas aeruginosa NOT DETECTED NOT DETECTED Final   Stenotrophomonas maltophilia NOT DETECTED NOT DETECTED Final   Candida albicans NOT DETECTED NOT DETECTED Final   Candida auris NOT DETECTED NOT DETECTED Final   Candida glabrata NOT DETECTED NOT DETECTED Final   Candida krusei NOT DETECTED NOT DETECTED Final   Candida parapsilosis NOT DETECTED NOT DETECTED Final   Candida tropicalis NOT DETECTED NOT DETECTED Final   Cryptococcus neoformans/gattii NOT DETECTED NOT DETECTED Final   CTX-M ESBL NOT DETECTED NOT DETECTED Final   Carbapenem resistance IMP NOT DETECTED NOT DETECTED Final   Carbapenem resistance KPC NOT DETECTED NOT DETECTED Final   Carbapenem resistance NDM NOT DETECTED NOT DETECTED Final   Carbapenem resist OXA 48 LIKE NOT DETECTED NOT DETECTED Final   Carbapenem resistance VIM NOT DETECTED NOT DETECTED Final    Comment:  Performed at [facility], [address] [place], [place] [address]         Radiology Studies: US PELVIS LIMITED (TRANSABDOMINAL ONLY)  Result Date: 07/20/2020 CLINICAL DATA:  Hematuria. EXAM: LIMITED ULTRASOUND OF PELVIS TECHNIQUE: Limited transabdominal ultrasound examination of the pelvis was performed. COMPARISON:  None. FINDINGS: Limited sonographic evaluation of the  urinary bladder was noted. Foley catheter appears to be well position within the bladder lumen. No bladder wall thickening is noted. No definite hematoma or other abnormality is noted within the lumen. IMPRESSION: Foley catheter is well positioned within bladder lumen. No other abnormality is noted involving urinary bladder. Electronically Signed   By: Lupita Raider M.D.   On: 07/17/2020 16:08   ECHOCARDIOGRAM COMPLETE  Result Date: 07/12/2020    ECHOCARDIOGRAM REPORT   Patient Name:   Lucas Garcia Date of Exam: 08/04/2020 Medical Rec #:  [id number]        Height:       65.0 in Accession #:    [id number]       Weight:       160.0 lb Date of Birth:  January 23, 1946        BSA:          1.799 m Patient Age:    74 years         BP:           101/71 mmHg Patient Gender: M                HR:           125 bpm. Exam Location:  Inpatient Procedure: 2D Echo, Cardiac Doppler, Color Doppler and Intracardiac            Opacification Agent Indications:    Atrial fibrillation  History:        Patient has no prior history of Echocardiogram examinations.                 CAD; Arrythmias:Atrial Fibrillation. AAA s/p reapir. CKD.  Sonographer:    Ross Ludwig RDCS (AE) Referring Phys: [id number] Aliene Beams  Sonographer Comments: Technically difficult study due to poor echo windows. IMPRESSIONS  1. Left ventricular ejection fraction, by estimation, is 55 to 60%. The left ventricle has normal function. The left ventricle has no regional wall motion abnormalities. There is moderate concentric left ventricular hypertrophy. Left ventricular diastolic function could not be evaluated.  2. Right ventricular systolic function was not well visualized. The right ventricular size is normal.  3. Left atrial size was severely dilated.  4. Right atrial size was moderately dilated.  5. The mitral valve is grossly normal. Trivial mitral valve regurgitation. No evidence of mitral stenosis.  6. The aortic valve is grossly normal. There is mild  calcification of the aortic valve. Aortic valve regurgitation is not visualized. Mild aortic valve sclerosis is present, with no evidence of aortic valve stenosis.  7. Aortic dilatation noted. There is borderline dilatation of the ascending aorta, measuring 38 mm.  8. The inferior vena cava is dilated in size with >50% respiratory variability, suggesting right atrial pressure of 8 mmHg. Comparison(s): No prior Echocardiogram. Conclusion(s)/Recommendation(s): Grossly normal LVEF without focal wall motion abnormalities. Technically challenging study, even with use of echo contrast. FINDINGS  Left Ventricle: Left ventricular ejection fraction, by estimation, is 55 to 60%. The left ventricle has normal function. The left ventricle has no regional wall motion abnormalities. Definity contrast agent was given IV to delineate the left  ventricular  endocardial borders. The left ventricular internal cavity size was normal in size. There is moderate concentric left ventricular hypertrophy. Left ventricular diastolic function could not be evaluated due to atrial fibrillation. Left ventricular diastolic function could not be evaluated. Right Ventricle: The right ventricular size is normal. Right vetricular wall thickness was not well visualized. Right ventricular systolic function was not well visualized. Left Atrium: Left atrial size was severely dilated. Right Atrium: Right atrial size was moderately dilated. Pericardium: Trivial pericardial effusion is present. Presence of pericardial fat pad. Mitral Valve: The mitral valve is grossly normal. Trivial mitral valve regurgitation. No evidence of mitral valve stenosis. Tricuspid Valve: The tricuspid valve is grossly normal. Tricuspid valve regurgitation is trivial. Aortic Valve: The aortic valve is grossly normal. There is mild calcification of the aortic valve. Aortic valve regurgitation is not visualized. Mild aortic valve sclerosis is present, with no evidence of aortic valve  stenosis. Aortic valve mean gradient  measures 2.3 mmHg. Aortic valve peak gradient measures 3.6 mmHg. Aortic valve area, by VTI measures 2.92 cm. Pulmonic Valve: The pulmonic valve was not well visualized. Pulmonic valve regurgitation is not visualized. Aorta: Aortic dilatation noted. There is borderline dilatation of the ascending aorta, measuring 38 mm. Venous: The inferior vena cava is dilated in size with greater than 50% respiratory variability, suggesting right atrial pressure of 8 mmHg. IAS/Shunts: The atrial septum is grossly normal.  LEFT VENTRICLE PLAX 2D LVIDd:         4.00 cm LVIDs:         3.00 cm LV PW:         1.60 cm LV IVS:        1.30 cm LVOT diam:     2.30 cm LV SV:         37 LV SV Index:   21 LVOT Area:     4.15 cm  RIGHT VENTRICLE          IVC RV Basal diam:  3.10 cm  IVC diam: 2.10 cm TAPSE (M-mode): 0.9 cm LEFT ATRIUM             Index       RIGHT ATRIUM           Index LA diam:        4.30 cm 2.39 cm/m  RA Area:     20.00 cm LA Vol (A2C):   62.4 ml 34.68 ml/m RA Volume:   53.50 ml  29.74 ml/m LA Vol (A4C):   95.9 ml 53.30 ml/m LA Biplane Vol: 81.0 ml 45.02 ml/m  AORTIC VALVE AV Area (Vmax):    3.14 cm AV Area (Vmean):   2.84 cm AV Area (VTI):     2.92 cm AV Vmax:           94.83 cm/s AV Vmean:          70.033 cm/s AV VTI:            0.128 m AV Peak Grad:      3.6 mmHg AV Mean Grad:      2.3 mmHg LVOT Vmax:         71.78 cm/s LVOT Vmean:        47.825 cm/s LVOT VTI:          0.090 m LVOT/AV VTI ratio: 0.70  AORTA Ao Root diam: 4.30 cm Ao Asc diam:  3.80 cm  SHUNTS Systemic VTI:  0.09 m Systemic Diam: 2.30 cm Jodelle Red MD Electronically signed  by Jodelle Red MD Signature Date/Time: 2020/08/09/7:50:46 PM    Final         Scheduled Meds: . acetaminophen  1,000 mg Oral TID  . antiseptic oral rinse  15 mL Topical BID   Continuous Infusions: . morphine       LOS: 2 days    Time spent:    Erick Blinks, MD [facility]   If  7PM-7AM, please contact night-coverage [email or web address]  07/16/2020, 10:54 AM

## 2020-08-09 NOTE — Progress Notes (Addendum)
Pt wedding ring removed and place in a plastic bag left at nurses station. Left message to pt spouse  Cell number to pick up the ring here in 6N.

## 2020-08-09 NOTE — Progress Notes (Signed)
Daily Progress Note   Patient Name: Lucas Garcia [id number]       Date: 07/19/2020 DOB: 01-22-1946  Age: 75 y.o. MRN#: [id number] Attending Physician: Erick Blinks, MD Primary Care Physician: Pcp, No Admit Date: 08/06/2020  Reason for Consultation/Follow-up: end of life care, symptom management  Subjective: Dr. Kerry Hough is at bedside, updating family. Patient appears uncomfortable with labored breathing. He is restless and complains intermittently of back pain. Dr. Kerry Hough has recommended starting a morphine infusion - I agree and offer to enter the order.   Several family members present at bedside. Education and counseling provided on expectations at EOL. Emotional support provided. Provided reassurance that morphine infusion will provide more proactive pain management and ensure his comfort at EOL. Provided family with a copy of "gone from my sight" book.   16:15--Returned to bedside to reassess patient. Morphine infusion is at 3 mg/hr. Patient appears comfortable. No non-verbal signs of pain or discomfort noted. Respirations are even and unlabored. No excessive respiratory secretions noted.    Length of Stay: 2  Current Medications: Scheduled Meds:  . acetaminophen  1,000 mg Oral TID  . antiseptic oral rinse  15 mL Topical BID    Continuous Infusions: . morphine      PRN Meds: acetaminophen **OR** acetaminophen, docusate sodium, glycopyrrolate **OR** glycopyrrolate **OR** glycopyrrolate, haloperidol **OR** haloperidol **OR** haloperidol lactate, HYDROcodone-acetaminophen, LORazepam **OR** LORazepam **OR** LORazepam, morphine injection, morphine, ondansetron **OR** ondansetron (ZOFRAN) IV, polyethylene glycol, polyvinyl alcohol       Vital Signs: BP 135/90 (BP Location: Right Arm)   Pulse  (!) 127   Temp 98 F (36.7 C) (Oral)   Resp (!) 22   Ht 5\' 5"  (1.651 m)   Wt 72.6 kg   SpO2 (!) 84%   BMI 26.63 kg/m  SpO2: SpO2: (!) 84 % O2 Device: O2 Device: Room Air O2 Flow Rate: O2 Flow Rate (L/min): 2 L/min  Intake/output summary:   Intake/Output Summary (Last 24 hours) at 07/11/2020 1047 Last data filed at 08/01/2020 0201 Gross per 24 hour  Intake 1069.46 ml  Output 575 ml  Net 494.46 ml   LBM: Last BM Date: 07/23/20 Baseline Weight: Weight: 72.6 kg Most recent weight: Weight: 72.6 kg       Palliative Assessment/Data: PPS 10-20%       Palliative Care Assessment &  Plan   Patient Profile: 75 y.o.malewith past medical history of atrial fibrillation on Xarelto, CAD, history of AAA s/p endovascular repair presented to the [facility] ED on 4/15 from home after an unwitnessed fall. Patient was being treated at the [facility] for two weeks for an "infection," but further details are unclear. [facility] shock (unclear if septic, hemorrhagic, hypovolemic), normocytic anemia with elevated INR, renal insufficiency, atrial fibrillation with RVR, encephalopathy.   Assessment: Sepsis UTI Atrial fibrillation with RVR Anemia Acute kidney injury CAD Septic encephalopathy Terminal care  Recommendations/Plan: Continue full comfort measures DNR/DNI as previously documented Start morphine infusion for comfort Transfer to [facility] when bed is available - if patient becomes too unstable for transfer will remain in house for EOL Nursing to continue administering PRN medications as clinical necessary to ensure EOL comfort PMT will continue to follow  Goals of Care and Additional Recommendations: Limitations on Scope of Treatment: Full Comfort Care  Code Status: DNR/DNI  Prognosis: Hours-Days  Discharge Planning: Residential hospice versus hospital death  Care plan was discussed with Dr. Kerry Hough and bedside  RN  Thank you for allowing the Palliative Medicine Team to assist in the care of this patient.   Total Time 25 minutes Prolonged Time Billed  no       Greater than 50%  of this time was spent counseling and coordinating care related to the above assessment and plan.  Merry Proud, NP  Please contact Palliative Medicine Team phone at [phone] for questions and concerns.

## 2020-08-09 DEATH — deceased

## 2022-01-19 IMAGING — CT CT CTA ABD/PEL W/CM AND/OR W/O CM
2 of 6 series · 14 of 46 positions shown, 16 images · IV contrast (OMNI 350)
Comparison: CTA abdomen and pelvis 06/12/2018.

CLINICAL DATA: 74-year-old male with history of AAA treated with
bifurcated abdominal aortic endograft. Found down. Unwitnessed fall.
Low back pain. Recently hospitalized for "infection".

EXAM:
CTA ABDOMEN AND PELVIS WITHOUT AND WITH CONTRAST
TECHNIQUE: Multidetector CT imaging of the abdomen and pelvis was performed
using the standard protocol during bolus administration of
intravenous contrast. Multiplanar reconstructed images and MIPs were
obtained and reviewed to evaluate the vascular anatomy.
CONTRAST:  75mL OMNIPAQUE IOHEXOL 350 MG/ML SOLN

[Series 5: dissection 2mm · axial · 0.85mm/px · z∈[+810,+1264]mm · 11 of 251 slices shown, 13 images]
[im 12/251  soft-tissue]
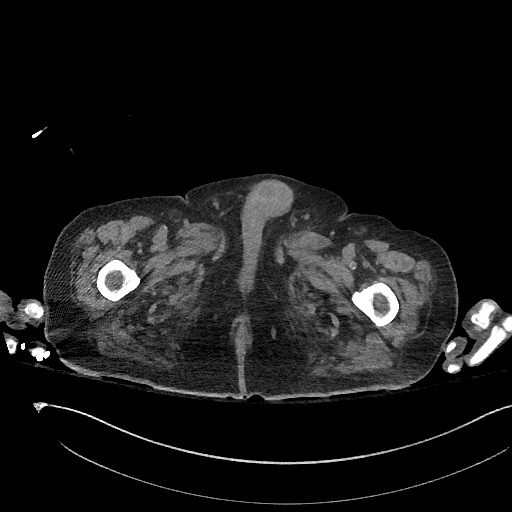
[im 12/251  bone]
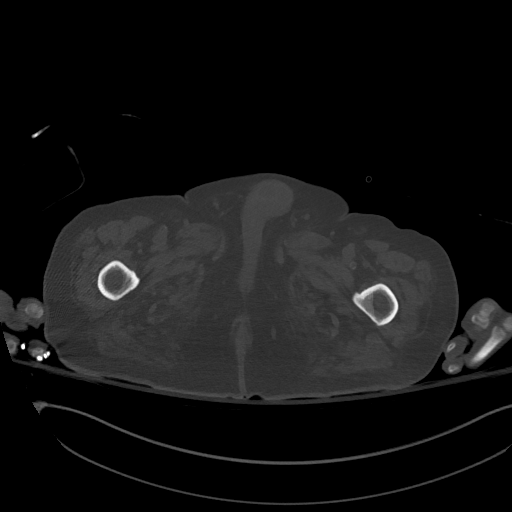
[im 35/251  soft-tissue]
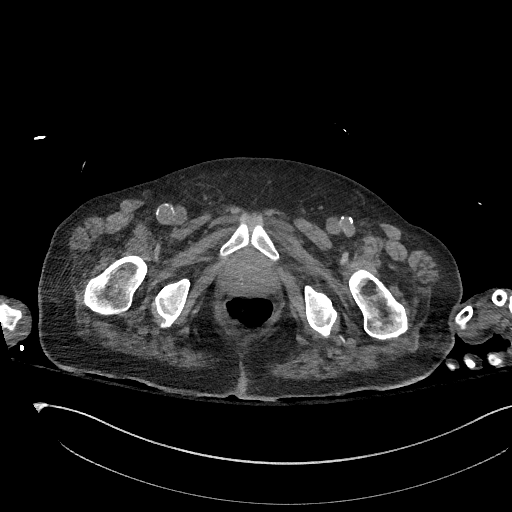
[im 57/251  soft-tissue]
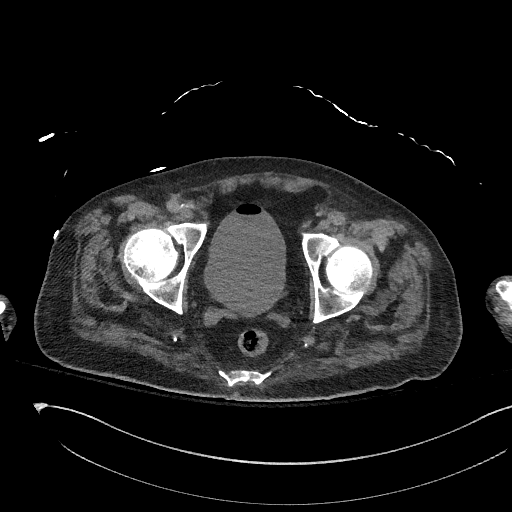
[im 80/251  soft-tissue]
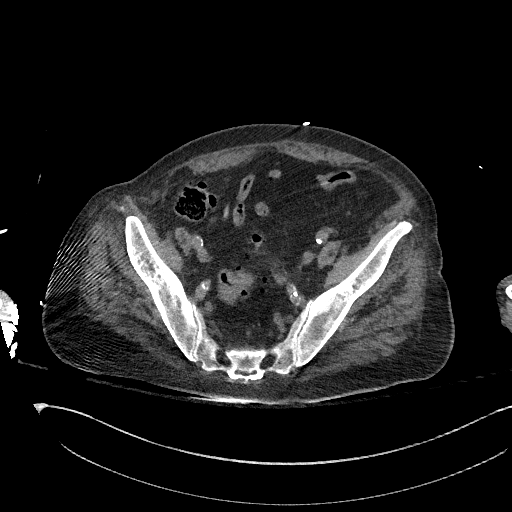
[im 103/251  soft-tissue]
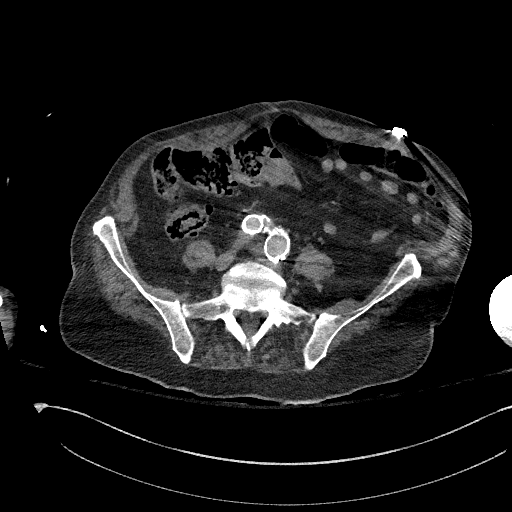
[im 126/251  soft-tissue]
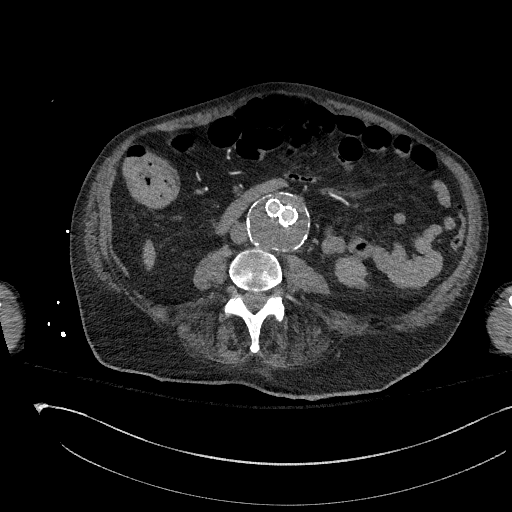
[im 148/251  soft-tissue]
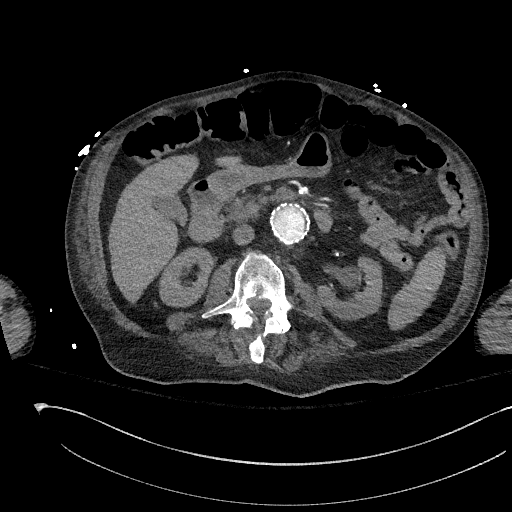
[im 171/251  soft-tissue]
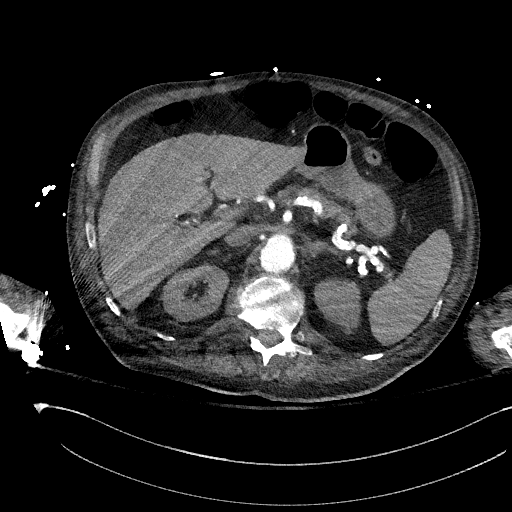
[im 194/251  soft-tissue]
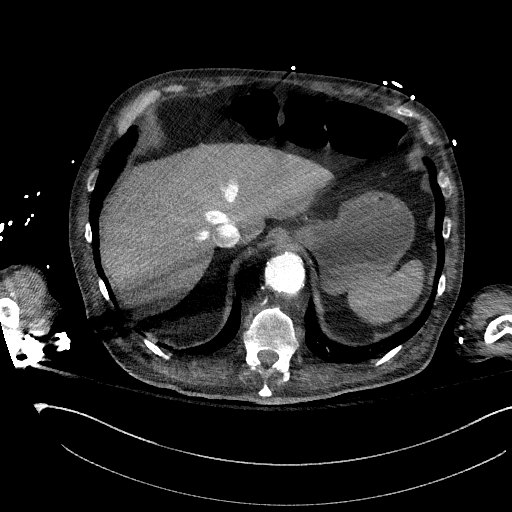
[im 194/251  bone]
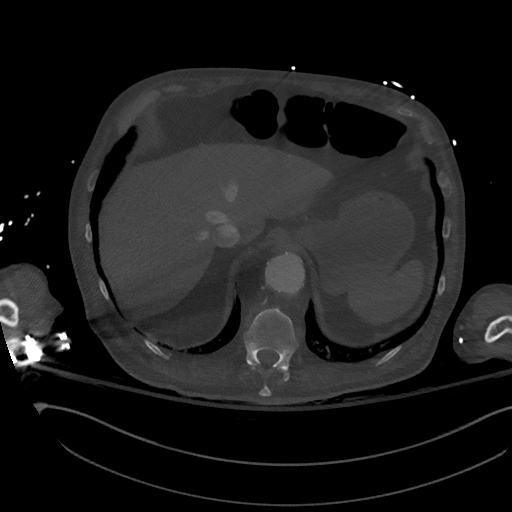
[im 216/251  soft-tissue]
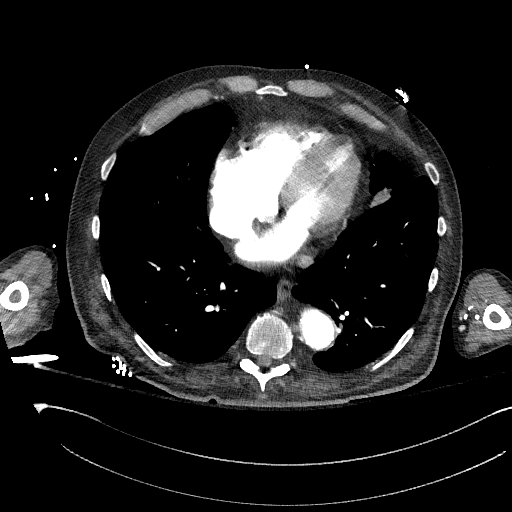
[im 239/251  soft-tissue]
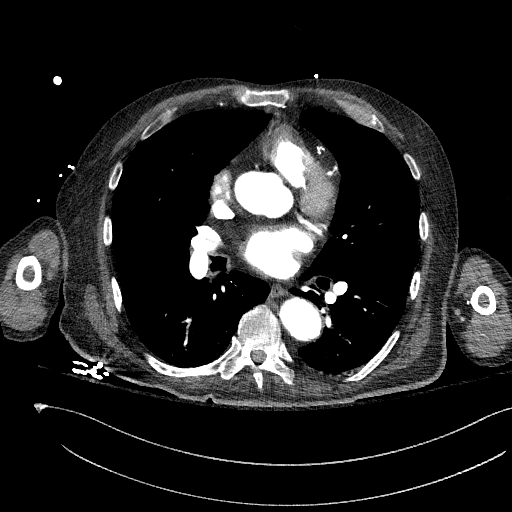

[Series 8: dissection 2mm cor · coronal · 0.77mm/px · 3 of 154 slices shown]
[im 39/154  soft-tissue]
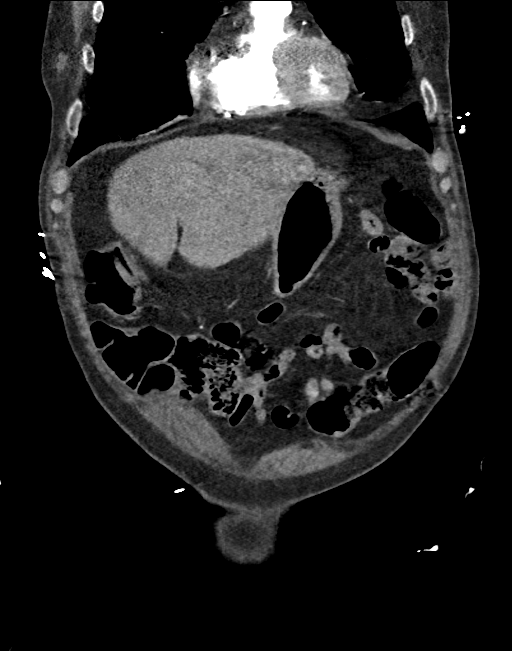
[im 77/154  soft-tissue]
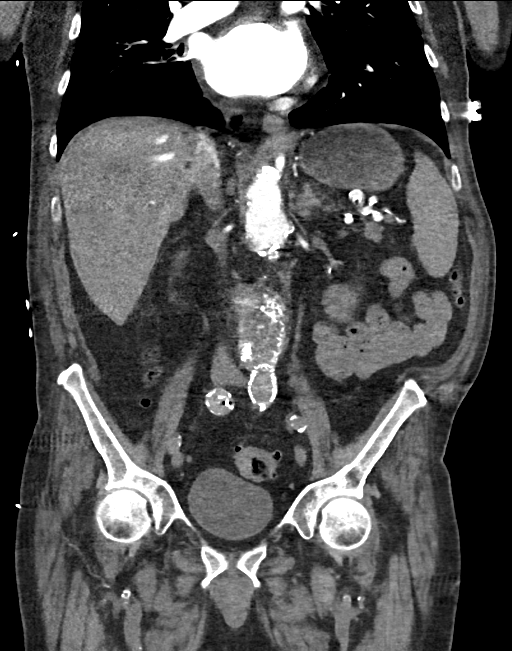
[im 115/154  soft-tissue]
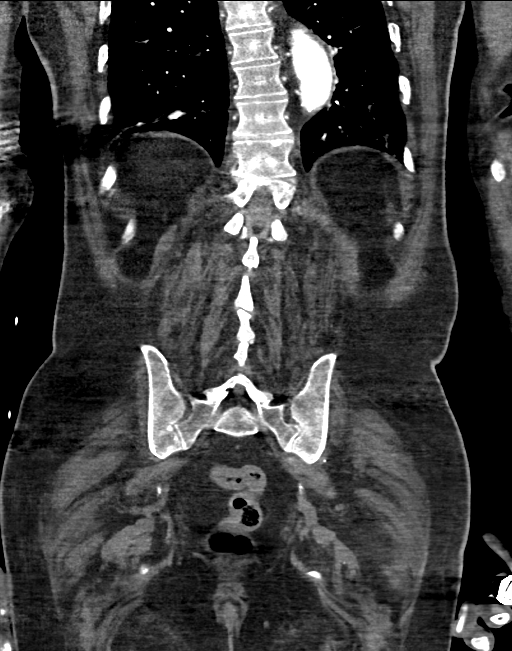

[14 of 46 positions shown; findings below may reference images not displayed]

FINDINGS: VASCULAR

Early arterial phase imaging only on this exam. Tortuous descending
thoracic aorta with soft and calcified plaque. Infrarenal abdominal
aortic aneurysm with chronic bifurcated endograft. The endograft
appears patent although the imaging appears to out run the bolus at
the level of the native iliac arteries. The native AAA aneurysm sac
now measures up to 53-56 mm diameter, which is decreased from 58-60
mm in 0606.

Stable configuration of the endograft and iliac limbs compared to
0606. No in stent stenosis suspected. But insufficient contrast in
the iliac and femoral arteries to evaluate patency. Calcified
iliofemoral atherosclerosis again noted. She

Review of the MIP images confirms the above findings.

NON-VASCULAR

Lower chest: Stable mild cardiomegaly since 0606. No pericardial
effusion. Tortuous descending thoracic aorta with atherosclerosis.
Evidence of prior CABG. Some centrilobular emphysema identified in
both lungs. There is peripheral left lower lobe mild pulmonary
ground-glass and peribronchial nodularity (series 6, image 47), new
since 0606. Platelike atelectasis in the right lower lobe is new in
addition to more subtle tree-in-bud nodular opacities there.
Improved chronic scarring and atelectasis in the right middle lobe,
stable and the lingula.

Hepatobiliary: There is some reflux of contrast from the right
ventricle into the hepatic IVC and hepatic veins. Otherwise negative
liver and gallbladder.

Pancreas: Negative.

Spleen: Negative.

Adrenals/Urinary Tract: Normal adrenal glands. Stable, nonobstructed
kidneys. Chronic left renal lower pole cyst. Bilateral renal
vascular calcifications. Symmetric early arterial phase renal
enhancement. Ureters appear stable, nondilated.

Urinary bladder remarkable for a small chronic right lateral bladder
diverticulum, but also non dependent gas within the bladder (series
5, image 192). No definite perivesical stranding.

Stomach/Bowel: Gas in the rectum which is otherwise negative.
Diverticulosis throughout the sigmoid colon. No active inflammation
identified. Similar diverticulosis in the descending colon which is
decompressed. Gas-filled but otherwise negative transverse colon.
Redundant right colon with retained stool and also diverticulosis.
Normal appendix on series 5, image 169. The cecum is on a lax
mesentery. No large bowel inflammation identified.

Decompressed terminal ileum. No dilated small bowel. Stomach and
duodenum are within normal limits. No free air, free fluid,
mesenteric inflammation.

Lymphatic: No lymphadenopathy.

Reproductive: Negative.

Other: No pelvic free fluid.

Musculoskeletal: Prior sternotomy. No lower rib fracture identified.
Visible spine appears stable since 0606. Sacrum, SI joints, pelvis
and proximal femurs appear stable and intact.
IMPRESSION: 1. Early arterial phase imaging only, and possible degree of
underlying right heart failure (reflux of right heart contrast into
the hepatic IVC), such that there is no arterial contrast in the
pelvis.

2. But stable and satisfactory Endograft repair of chronic AAA, with
decreased size of the native aneurysm sac since 0606 (now 53-56 mm
diameter). Superimposed Aortic Atherosclerosis (Z9ERC-N0E.E),
calcified iliofemoral atherosclerosis.

3. Emphysema (Z9ERC-EA9.L) with suspected Acute Infectious
Exacerbation in both lower lobes in the form of mild peribronchial
and tree-in-bud pulmonary opacity. No pleural effusion or
consolidation.

4. No acute or inflammatory process identified in the abdomen or
pelvis. Extensive large bowel diverticulosis with no active
inflammation identified. Normal appendix.

5. Cardiomegaly.  Prior CABG.

## 2022-01-19 IMAGING — CT CT HEAD W/O CM
3 of 4 series · 13 of 47 positions shown, 15 images · non-contrast
Comparison: None.

CLINICAL DATA: Unwitnessed fall, on throughout the

EXAM:
CT HEAD WITHOUT CONTRAST
CT CERVICAL SPINE WITHOUT CONTRAST
TECHNIQUE: Multidetector CT imaging of the head and cervical spine was
performed following the standard protocol without intravenous
contrast. Multiplanar CT image reconstructions of the cervical spine
were also generated.

[Series 3: head without · axial · non-contrast · 0.41mm/px · z∈[-157,-37]mm · 7 of 34 slices shown, 9 images]
[im 5/34  brain]
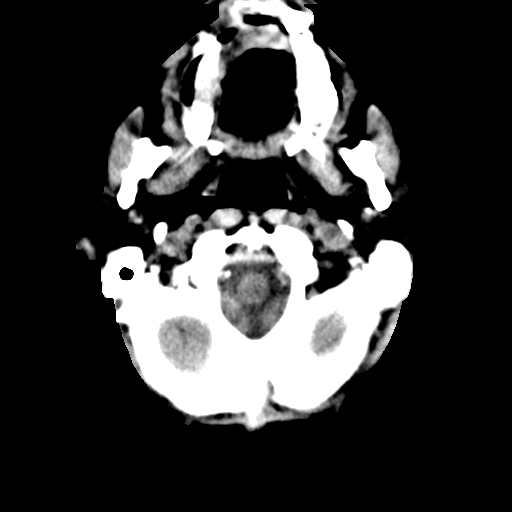
[im 5/34  bone]
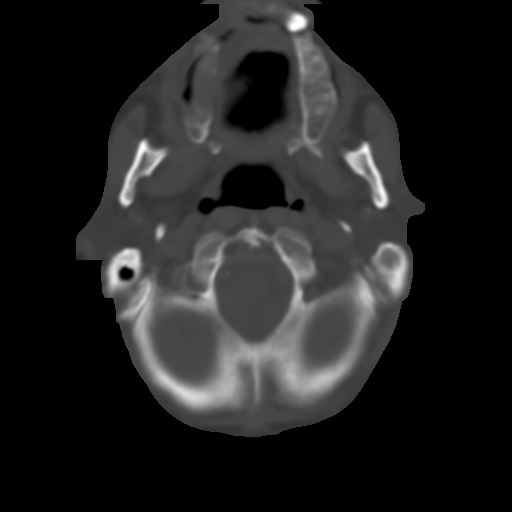
[im 9/34  brain]
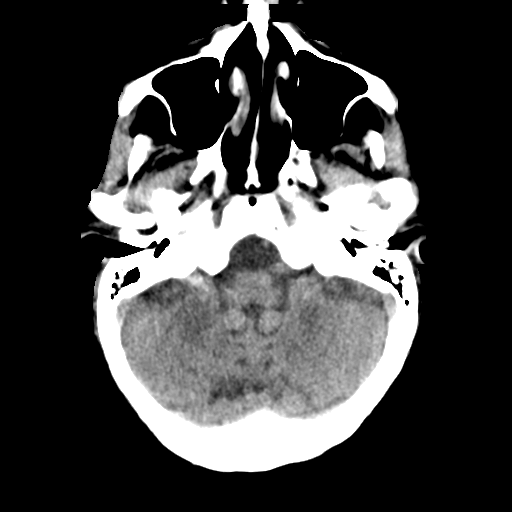
[im 13/34  brain]
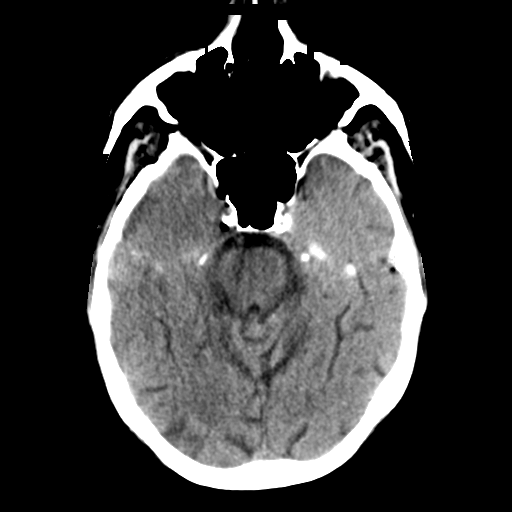
[im 17/34  brain]
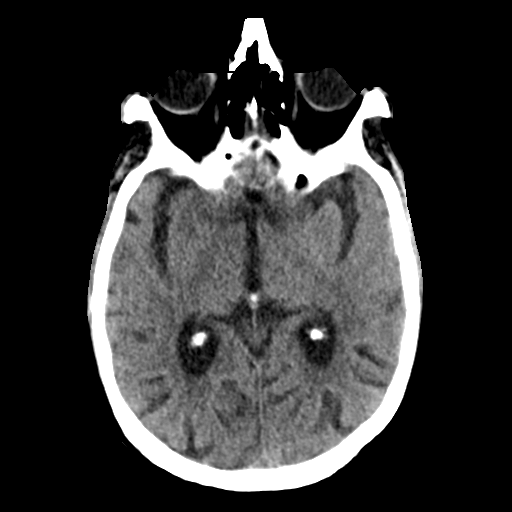
[im 21/34  brain]
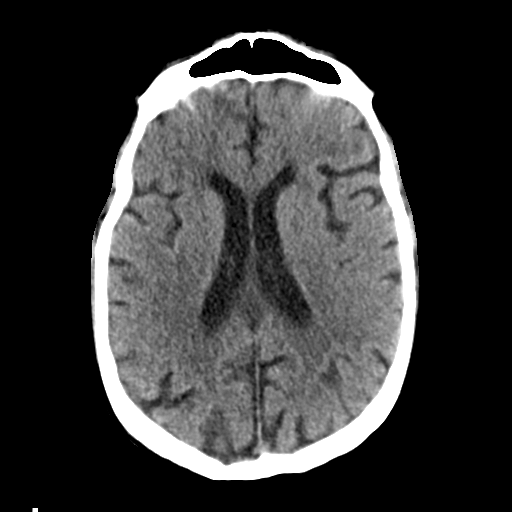
[im 21/34  bone]
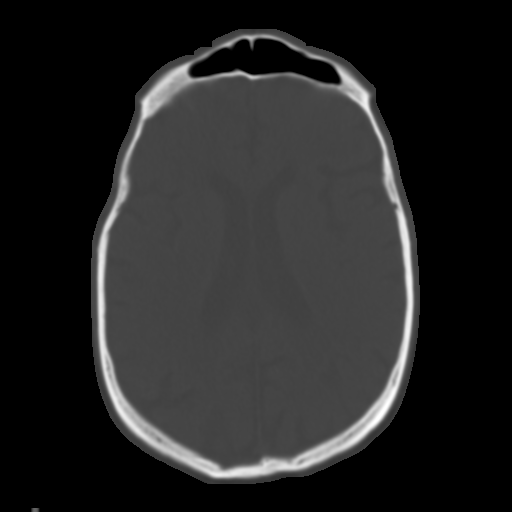
[im 25/34  brain]
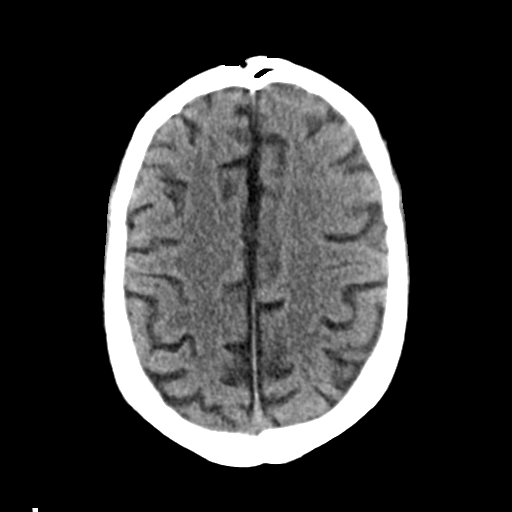
[im 29/34  brain]
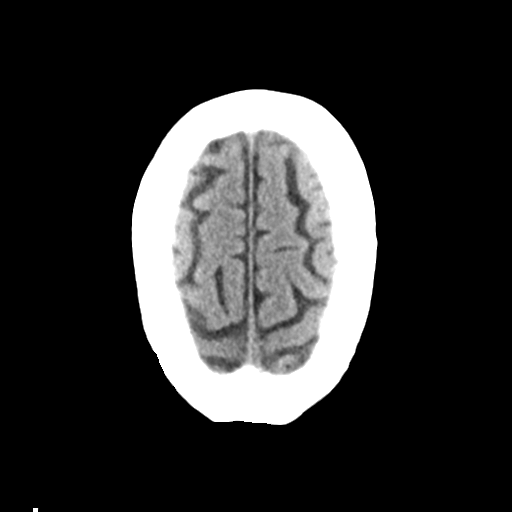

[Series 5: head without cor · coronal · non-contrast · 0.30mm/px · 3 of 68 slices shown]
[im 23/68  brain]
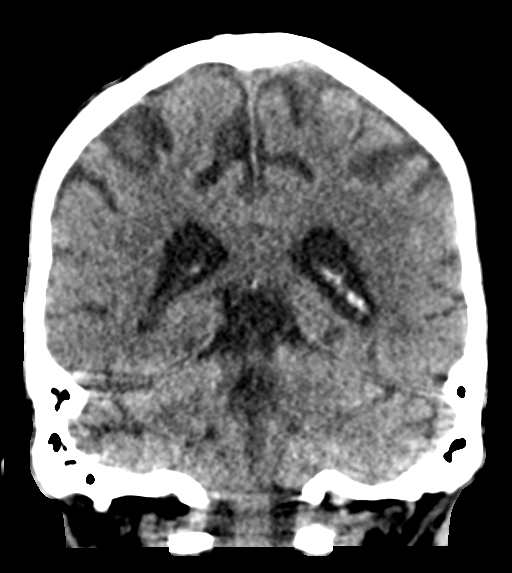
[im 30/68  brain]
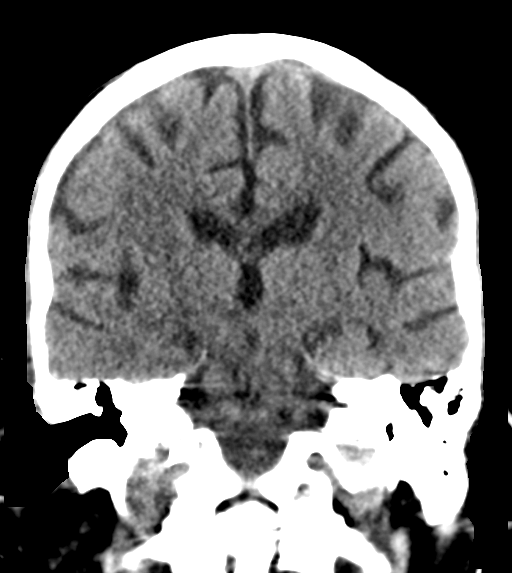
[im 38/68  brain]
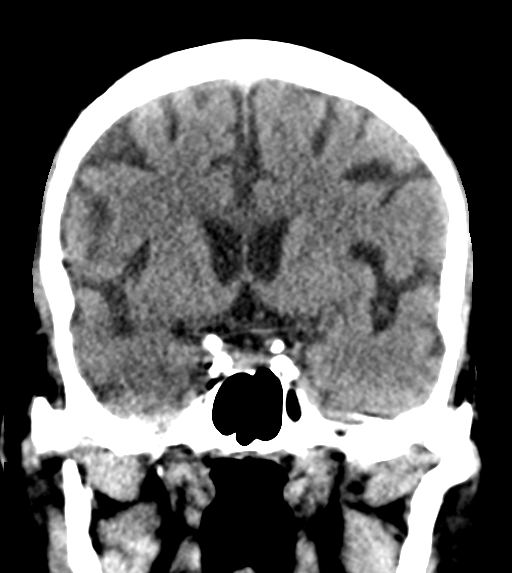

[Series 6: head without sag · sagittal · non-contrast · 0.33mm/px · 3 of 66 slices shown]
[im 22/66  brain]
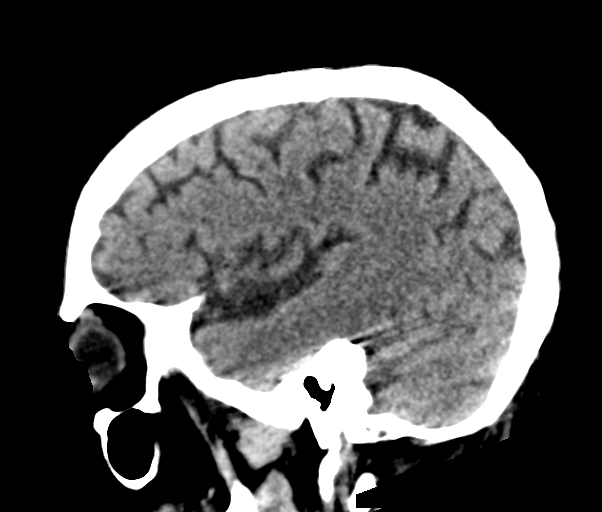
[im 33/66  brain]
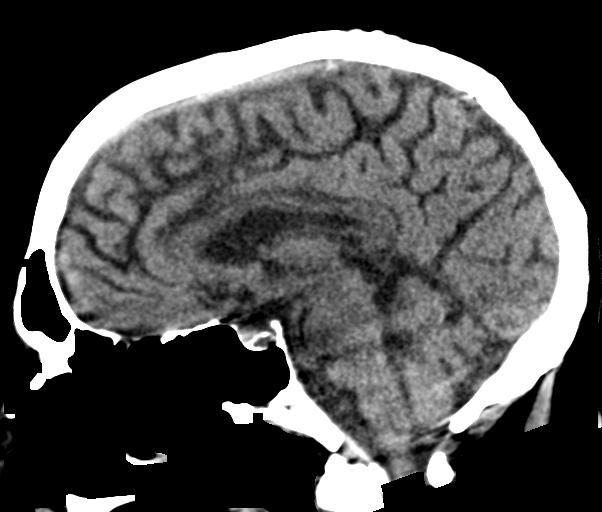
[im 44/66  brain]
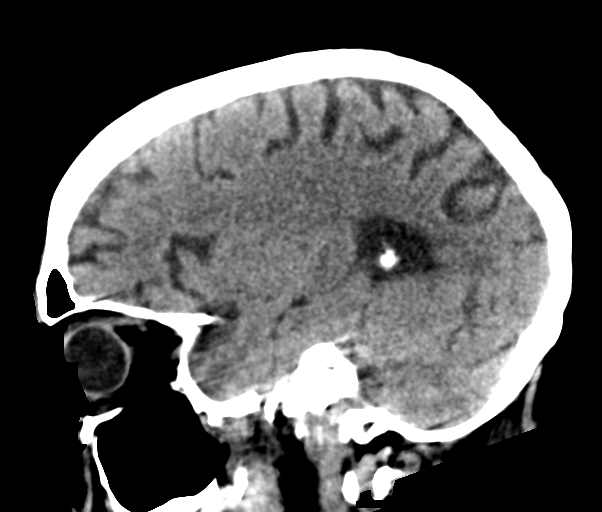

[13 of 47 positions shown; findings below may reference images not displayed]

FINDINGS: CT HEAD FINDINGS

Brain: No evidence of acute infarction, hemorrhage, hydrocephalus,
extra-axial collection, visible mass lesion or mass effect.
Symmetric prominence of the ventricles, cisterns and sulci
compatible with parenchymal volume loss. Patchy areas of white
matter hypoattenuation are most compatible with chronic
microvascular angiopathy.

Vascular: Atherosclerotic calcification of the carotid siphons and
intradural vertebral arteries. No hyperdense vessel. Gas within the
cavernous sinuses may be related to intravenous access.

Skull: No calvarial fracture or suspicious osseous lesion. No scalp
swelling or hematoma.

Sinuses/Orbits: Paranasal sinuses and mastoid air cells are
predominantly clear. Orbital structures are unremarkable aside from
prior lens extractions.

Other: Absence of the majority of the dentition with only solitary
remaining left maxillary cuspid and impacted right third mandibular
molar. Dental prosthesis in place.

CT CERVICAL SPINE FINDINGS

Alignment: Slight exaggeration of the normal cervical lordosis. Mild
rightward cranial rotation. No evidence of traumatic listhesis. No
abnormally widened, perched or jumped facets. Normal alignment of
the craniocervical and atlantoaxial articulations accounting for
cranial positioning.

Skull base and vertebrae: Motion degraded exam as well as photon
starvation below the level of C[DATE] limit detection of subtle
fractures or osseous abnormalities. Arthrosis, erosive changes and
calcific pannus formation posterior to the dens, nonspecific but
often seen with rheumatoid or CPPD arthropathy. No acute skull base
fracture. No vertebral body fracture or height loss. Normal bone
mineralization. No other worrisome osseous lesions. Additional
cervical spondylitic changes as below.

Soft tissues and spinal canal: No pre or paravertebral fluid or
swelling. No visible canal hematoma. Airways patent.

Disc levels: Multilevel intervertebral disc height loss with
spondylitic endplate changes. Degenerative spurring most pronounced
anteriorly. Disc osteophyte complex C3-4 result in some partial
effacement of the ventral thecal sac without significant canal
impingement. Additional uncinate spurring facet hypertrophic changes
are present throughout the cervical levels resulting in
mild-to-moderate foraminal narrowing C2-C4.

Upper chest: Apical scarring and emphysematous changes.
Calcifications of the aortic arch and proximal great vessels as well
as the cervical carotids. Prior sternotomy. Some additional foci of
gas noted in the right brachiocephalic vein.

Other: Diminutive or absent thyroid gland. No concerning thyroid
nodules or masses.
IMPRESSION: 1. No evidence of acute intracranial abnormality.
2. Mild parenchymal volume loss and chronic microvascular ischemic
white matter disease.
3. Gas within the cavernous sinuses and right brachiocephalic vein
may be related to intravenous access.
4. No evidence of acute fracture or traumatic listhesis of the
cervical spine.
5. Motion degraded exam as well as photon starvation below the level
of C[DATE] limit detection of subtle fractures or osseous
abnormalities.
6. Multilevel degenerative changes of the cervical spine, as above.
7. Erosive changes with athero cysts and calcific pannus formation
at the dens is overall nonspecific though can be seen with CPPD or
rheumatoid arthropathy.
8. Absence of the majority of the dentition with only solitary
remaining left maxillary cuspid and impacted right third mandibular
molar. Correlate with dental exam.
9. Aortic Atherosclerosis ([id number]).
